# Patient Record
Sex: Male | Born: 1970 | Race: White | Hispanic: No | Marital: Married | Smoking: Former smoker
Health system: Southern US, Community
[De-identification: ages and names within clinical notes are randomized; demographics above are authoritative.]

## PROBLEM LIST (undated history)

## (undated) DIAGNOSIS — F419 Anxiety disorder, unspecified: Secondary | ICD-10-CM

## (undated) DIAGNOSIS — Z860100 Personal history of colon polyps, unspecified: Secondary | ICD-10-CM

## (undated) DIAGNOSIS — E7800 Pure hypercholesterolemia, unspecified: Secondary | ICD-10-CM

## (undated) DIAGNOSIS — D649 Anemia, unspecified: Secondary | ICD-10-CM

## (undated) DIAGNOSIS — F988 Other specified behavioral and emotional disorders with onset usually occurring in childhood and adolescence: Secondary | ICD-10-CM

## (undated) DIAGNOSIS — G4721 Circadian rhythm sleep disorder, delayed sleep phase type: Secondary | ICD-10-CM

## (undated) DIAGNOSIS — H524 Presbyopia: Secondary | ICD-10-CM

## (undated) DIAGNOSIS — J45909 Unspecified asthma, uncomplicated: Secondary | ICD-10-CM

## (undated) DIAGNOSIS — Z8719 Personal history of other diseases of the digestive system: Secondary | ICD-10-CM

## (undated) DIAGNOSIS — K449 Diaphragmatic hernia without obstruction or gangrene: Secondary | ICD-10-CM

## (undated) DIAGNOSIS — K219 Gastro-esophageal reflux disease without esophagitis: Secondary | ICD-10-CM

## (undated) DIAGNOSIS — G4730 Sleep apnea, unspecified: Secondary | ICD-10-CM

## (undated) DIAGNOSIS — M1990 Unspecified osteoarthritis, unspecified site: Secondary | ICD-10-CM

## (undated) DIAGNOSIS — E669 Obesity, unspecified: Secondary | ICD-10-CM

## (undated) HISTORY — DX: Circadian rhythm sleep disorder, delayed sleep phase type: G47.21

## (undated) HISTORY — DX: Unspecified osteoarthritis, unspecified site: M19.90

## (undated) HISTORY — PX: LAPAROSCOPIC GASTRIC BAND REMOVAL WITH LAPAROSCOPIC GASTRIC SLEEVE RESECTION: SHX6498

## (undated) HISTORY — PX: COLONOSCOPY: SHX174

## (undated) HISTORY — DX: Anxiety disorder, unspecified: F41.9

## (undated) HISTORY — DX: Obesity, unspecified: E66.9

## (undated) HISTORY — PX: BACK SURGERY: SHX140

## (undated) HISTORY — DX: Unspecified asthma, uncomplicated: J45.909

## (undated) HISTORY — DX: Personal history of colonic polyps: Z86.010

## (undated) HISTORY — PX: SHOULDER SURGERY: SHX246

## (undated) HISTORY — PX: ESOPHAGOGASTRODUODENOSCOPY: SHX1529

## (undated) HISTORY — DX: Presbyopia: H52.4

## (undated) HISTORY — DX: Pure hypercholesterolemia, unspecified: E78.00

## (undated) HISTORY — DX: Personal history of other diseases of the digestive system: Z87.19

## (undated) HISTORY — PX: CHOLECYSTECTOMY: SHX55

## (undated) HISTORY — DX: Diaphragmatic hernia without obstruction or gangrene: K44.9

## (undated) HISTORY — PX: ARTHROSCOPIC REPAIR ACL: SUR80

## (undated) HISTORY — DX: Personal history of colon polyps, unspecified: Z86.0100

## (undated) HISTORY — PX: KNEE SURGERY: SHX244

## (undated) HISTORY — DX: Anemia, unspecified: D64.9

## (undated) HISTORY — DX: Gastro-esophageal reflux disease without esophagitis: K21.9

---

## 2011-07-19 DIAGNOSIS — E291 Testicular hypofunction: Secondary | ICD-10-CM | POA: Insufficient documentation

## 2011-07-19 DIAGNOSIS — F419 Anxiety disorder, unspecified: Secondary | ICD-10-CM | POA: Insufficient documentation

## 2011-09-28 HISTORY — PX: BARIATRIC SURGERY: SHX1103

## 2012-11-17 DIAGNOSIS — H04123 Dry eye syndrome of bilateral lacrimal glands: Secondary | ICD-10-CM | POA: Insufficient documentation

## 2012-11-17 DIAGNOSIS — Q078 Other specified congenital malformations of nervous system: Secondary | ICD-10-CM | POA: Insufficient documentation

## 2012-11-17 DIAGNOSIS — H524 Presbyopia: Secondary | ICD-10-CM | POA: Insufficient documentation

## 2013-05-31 DIAGNOSIS — R868 Other abnormal findings in specimens from male genital organs: Secondary | ICD-10-CM | POA: Insufficient documentation

## 2013-05-31 DIAGNOSIS — N4611 Organic oligospermia: Secondary | ICD-10-CM | POA: Insufficient documentation

## 2016-10-18 DIAGNOSIS — K219 Gastro-esophageal reflux disease without esophagitis: Secondary | ICD-10-CM | POA: Insufficient documentation

## 2017-01-21 DIAGNOSIS — G4733 Obstructive sleep apnea (adult) (pediatric): Secondary | ICD-10-CM | POA: Insufficient documentation

## 2017-10-23 ENCOUNTER — Other Ambulatory Visit: Payer: Self-pay

## 2017-10-23 ENCOUNTER — Emergency Department
Admission: EM | Admit: 2017-10-23 | Discharge: 2017-10-23 | Disposition: A | Discharge status: Home / Self Care | Payer: 59 | Source: Home / Self Care | Attending: Family Medicine | Admitting: Family Medicine

## 2017-10-23 ENCOUNTER — Emergency Department (INDEPENDENT_AMBULATORY_CARE_PROVIDER_SITE_OTHER): Payer: 59

## 2017-10-23 DIAGNOSIS — M5136 Other intervertebral disc degeneration, lumbar region: Secondary | ICD-10-CM | POA: Diagnosis not present

## 2017-10-23 DIAGNOSIS — M5442 Lumbago with sciatica, left side: Secondary | ICD-10-CM

## 2017-10-23 DIAGNOSIS — M545 Low back pain, unspecified: Secondary | ICD-10-CM

## 2017-10-23 DIAGNOSIS — G8929 Other chronic pain: Secondary | ICD-10-CM

## 2017-10-23 HISTORY — DX: Sleep apnea, unspecified: G47.30

## 2017-10-23 IMAGING — DX DG LUMBAR SPINE COMPLETE 4+V
5 series · 5 of 5 positions shown · non-contrast
Comparison: None.

CLINICAL DATA: Low back injury and pain while lifting heavy ladder
today. Initial encounter.

EXAM:
LUMBAR SPINE - COMPLETE 4+ VIEW

[l-spine ap]
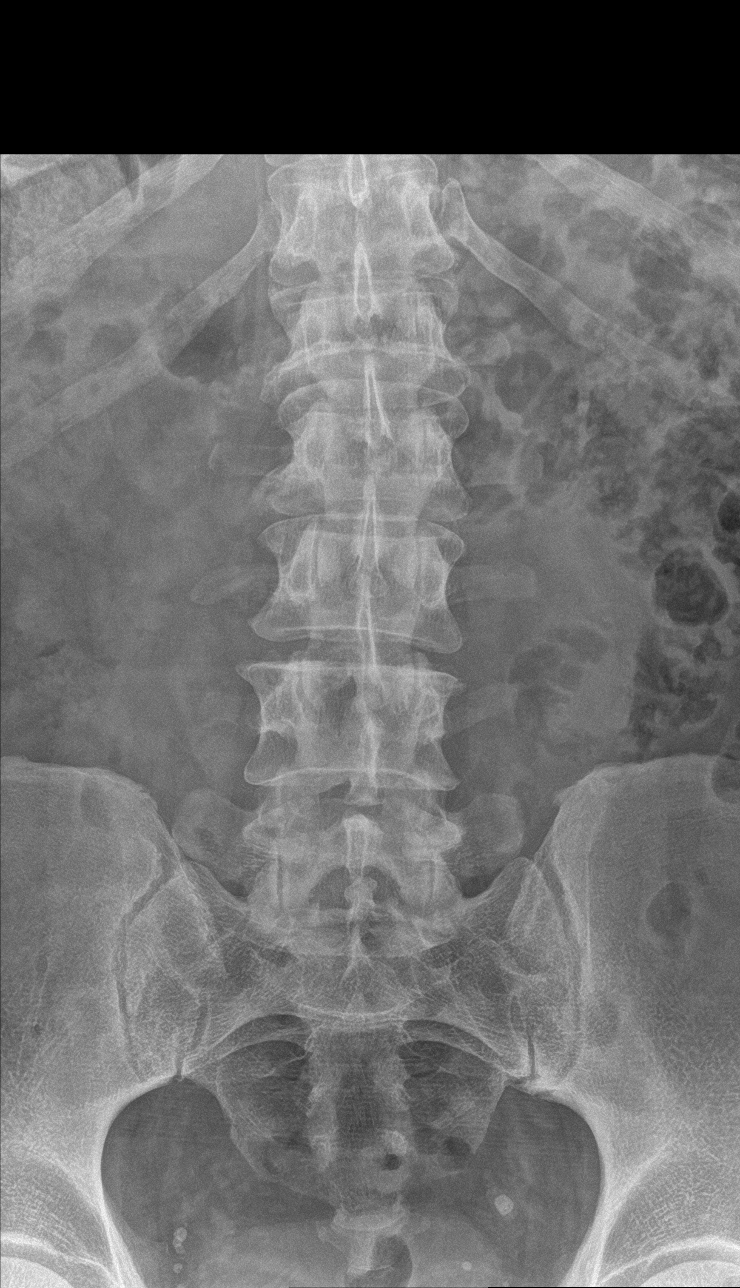

[l-spine obl (1 of 2)]
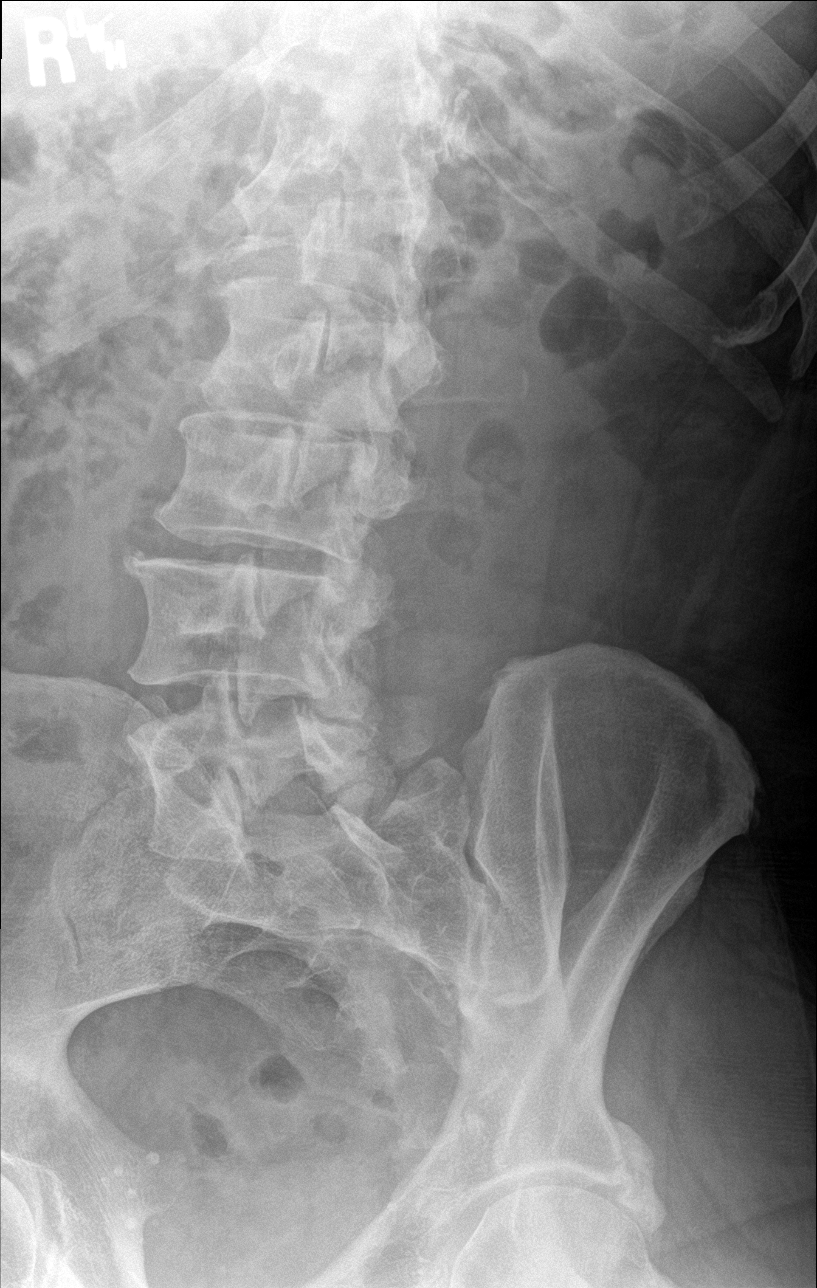

[l-spine obl (2 of 2)]
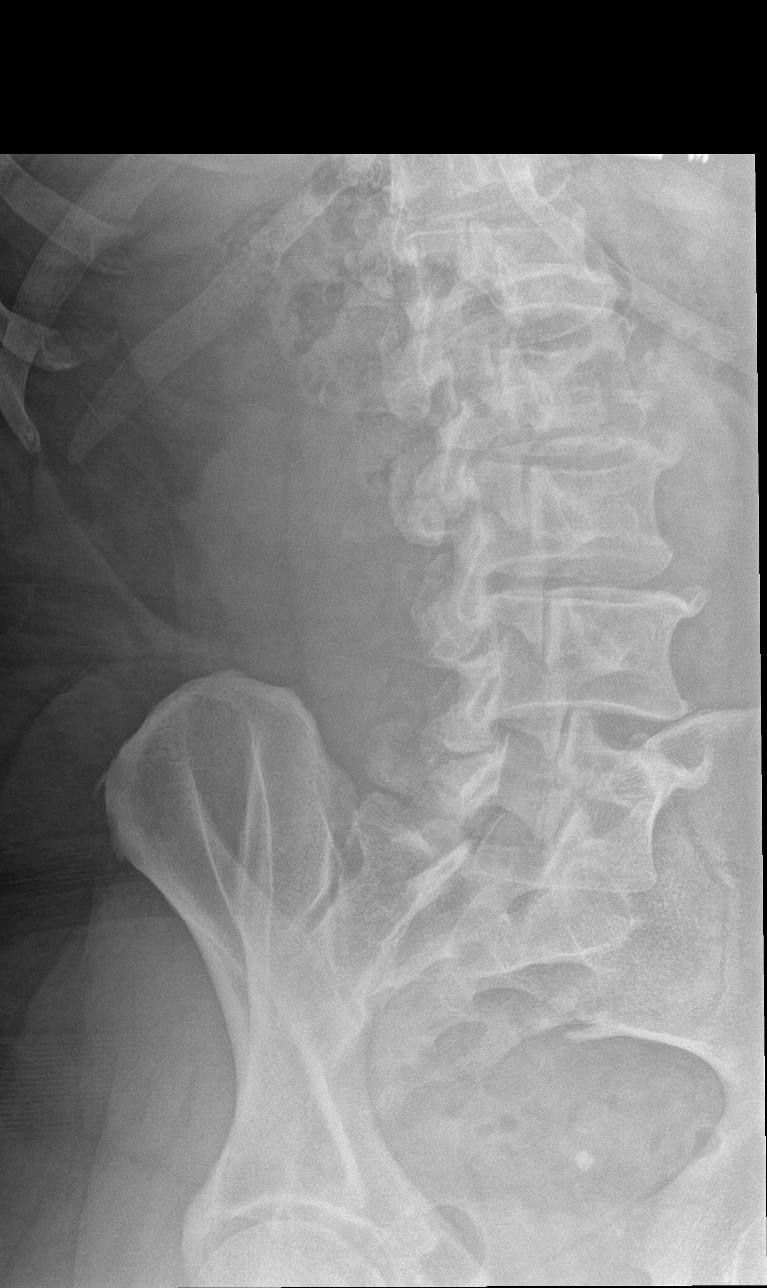

[l-spine lat]
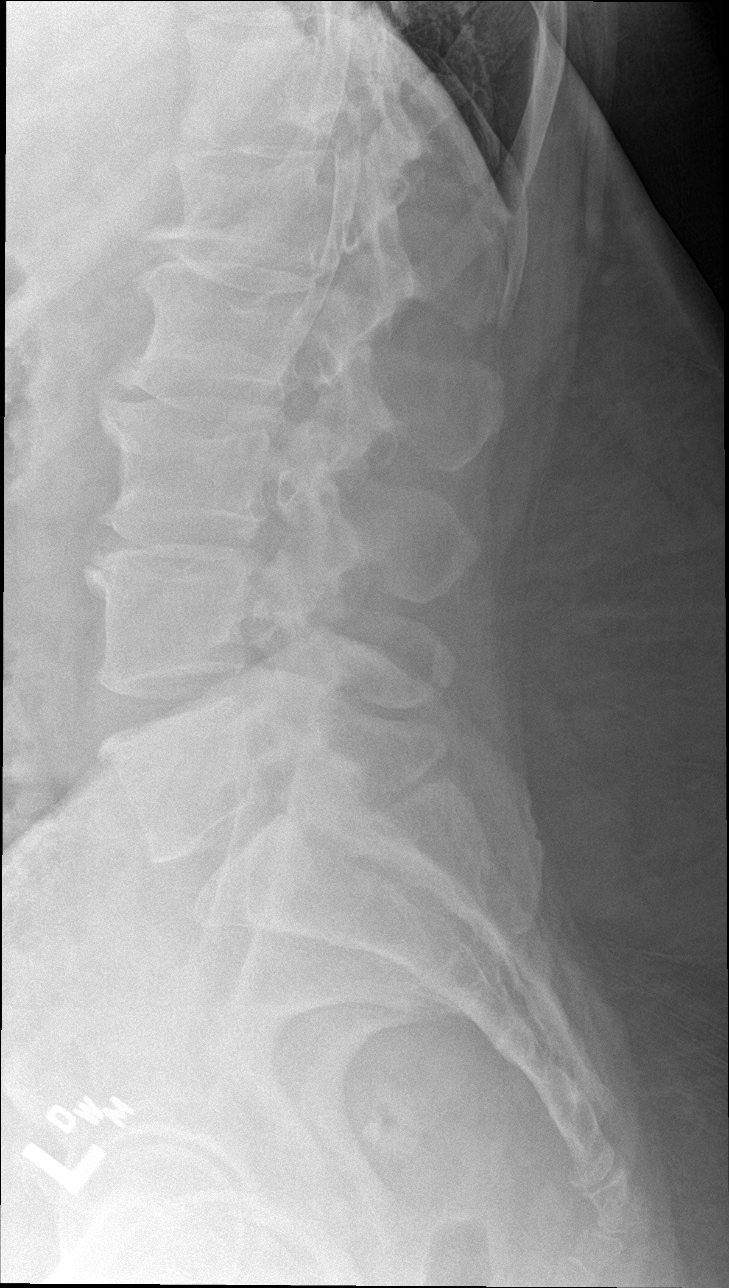

[l-spine spot]
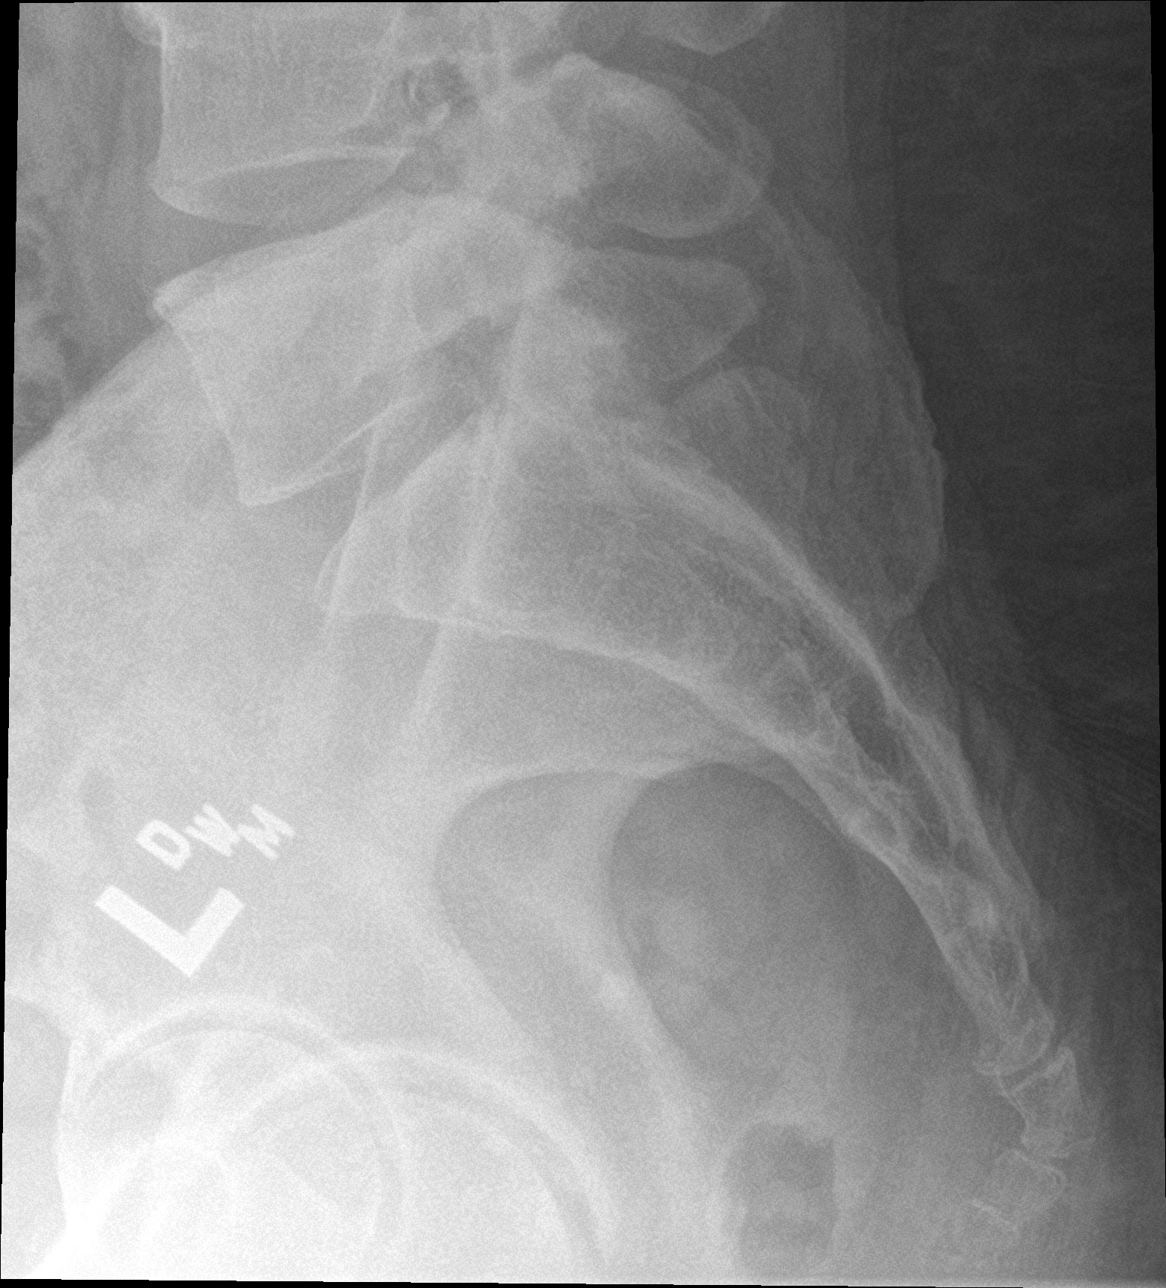

[5 of 5 positions shown; findings below may reference images not displayed]

FINDINGS: There is no evidence of lumbar spine fracture. Alignment is normal.
Mild degenerative disc disease seen from levels of L1-L4. No other
significant bone abnormality identified.
IMPRESSION: No acute findings.  Mild degenerative disc disease from L1-L4.

## 2017-10-23 MED ORDER — PREDNISONE 20 MG PO TABS: ORAL_TABLET | ORAL | 0 refills | Status: DC

## 2017-10-23 MED ORDER — METHOCARBAMOL 500 MG PO TABS: 500.0000 mg | ORAL_TABLET | Freq: Two times a day (BID) | ORAL | 0 refills | Status: DC

## 2017-10-23 MED ORDER — TRAMADOL HCL 50 MG PO TABS: 50.0000 mg | ORAL_TABLET | Freq: Four times a day (QID) | ORAL | 0 refills | Status: DC | PRN
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
## 2017-10-23 NOTE — ED Triage Notes (Signed)
Pt worked in yard cutting up trees yesterday, and was sore this morning, He continued working in [place] today, and was putting the ladder back in the garage, and when he lifted it, he felt a tweek in the lower back.

## 2017-10-23 NOTE — Discharge Instructions (Signed)
°  You may take 500mg  acetaminophen every 4-6 hours or in combination with ibuprofen 400-600mg  every 6-8 hours as needed for pain and inflammation.  You have been prescribed 5 days of prednisone, an oral steroid to help with inflammation and pain in your back. You may start this medication tomorrow with breakfast as it can make it difficult to sleep if taken at night.  Robaxin (methocarbamol) is a muscle relaxer and may cause drowsiness. Do not drink alcohol, drive, or operate heavy machinery while taking.  Tramadol is strong pain medication. While taking, do not drink alcohol, drive, or perform any other activities that requires focus while taking these medications.

## 2017-12-13 DIAGNOSIS — M47816 Spondylosis without myelopathy or radiculopathy, lumbar region: Secondary | ICD-10-CM | POA: Insufficient documentation

## 2018-09-06 DIAGNOSIS — M5416 Radiculopathy, lumbar region: Secondary | ICD-10-CM | POA: Insufficient documentation

## 2018-11-03 DIAGNOSIS — M47816 Spondylosis without myelopathy or radiculopathy, lumbar region: Secondary | ICD-10-CM | POA: Insufficient documentation

## 2018-11-03 DIAGNOSIS — M533 Sacrococcygeal disorders, not elsewhere classified: Secondary | ICD-10-CM | POA: Insufficient documentation

## 2020-03-16 DIAGNOSIS — S40012A Contusion of left shoulder, initial encounter: Secondary | ICD-10-CM | POA: Diagnosis not present

## 2020-03-16 DIAGNOSIS — S0990XA Unspecified injury of head, initial encounter: Secondary | ICD-10-CM | POA: Diagnosis not present

## 2020-03-16 DIAGNOSIS — S098XXA Other specified injuries of head, initial encounter: Secondary | ICD-10-CM | POA: Diagnosis not present

## 2020-03-16 DIAGNOSIS — Z23 Encounter for immunization: Secondary | ICD-10-CM | POA: Diagnosis not present

## 2020-03-16 DIAGNOSIS — Z043 Encounter for examination and observation following other accident: Secondary | ICD-10-CM | POA: Diagnosis not present

## 2020-03-16 DIAGNOSIS — S0001XA Abrasion of scalp, initial encounter: Secondary | ICD-10-CM | POA: Diagnosis not present

## 2020-03-16 DIAGNOSIS — S0003XA Contusion of scalp, initial encounter: Secondary | ICD-10-CM | POA: Diagnosis not present

## 2020-03-16 DIAGNOSIS — G44309 Post-traumatic headache, unspecified, not intractable: Secondary | ICD-10-CM | POA: Diagnosis not present

## 2020-03-16 DIAGNOSIS — M25512 Pain in left shoulder: Secondary | ICD-10-CM | POA: Diagnosis not present

## 2020-05-27 ENCOUNTER — Emergency Department
Admission: EM | Admit: 2020-05-27 | Discharge: 2020-05-27 | Disposition: A | Discharge status: Home / Self Care | Payer: 59 | Source: Home / Self Care | Attending: Family Medicine | Admitting: Family Medicine

## 2020-05-27 ENCOUNTER — Other Ambulatory Visit: Payer: Self-pay

## 2020-05-27 ENCOUNTER — Encounter: Payer: Self-pay | Admitting: Emergency Medicine

## 2020-05-27 DIAGNOSIS — Z20822 Contact with and (suspected) exposure to covid-19: Secondary | ICD-10-CM | POA: Diagnosis not present

## 2020-05-27 DIAGNOSIS — J069 Acute upper respiratory infection, unspecified: Secondary | ICD-10-CM

## 2020-05-27 HISTORY — DX: Other specified behavioral and emotional disorders with onset usually occurring in childhood and adolescence: F98.8

## 2020-05-27 NOTE — ED Provider Notes (Signed)
[name]    CSN: [id number] Arrival date & time: 05/27/20  1258      History   Chief Complaint Chief Complaint  Patient presents with  . Sore Throat    HPI Jared Tran is a 49 y.o. male.   Last night patient developed myalgias and mild cough.  Today he had some nasal congestion and sore throat.  He denies fevers, chills, and sweats.   He denies chest tightness, shortness of breath, and changes in taste/smell.    The history is provided by the patient.    Past Medical History:  Diagnosis Date  . ADD (attention deficit disorder)   . Sleep apnea     There are no problems to display for this patient.   Past Surgical History:  Procedure Laterality Date  . ARTHROSCOPIC REPAIR ACL    . LAPAROSCOPIC GASTRIC BAND REMOVAL WITH LAPAROSCOPIC GASTRIC SLEEVE RESECTION         Home Medications    Prior to Admission medications   Medication Sig Start Date End Date Taking? Authorizing Provider  amphetamine-dextroamphetamine (ADDERALL XR) 10 MG 24 hr capsule Take 10 mg by mouth daily.   Yes [provider]  clonazePAM (KLONOPIN) 1 MG tablet Take 1 mg by mouth 2 (two) times daily.    [provider]  liraglutide (VICTOZA) 18 MG/3ML SOPN Inject 1.8 mg into the skin.    [provider]    Family History Family History  Problem Relation Age of Onset  . Healthy Father     Social History Social History   Tobacco Use  . Smoking status: Never Smoker  . Smokeless tobacco: Never Used  Vaping Use  . Vaping Use: Never used  Substance Use Topics  . Alcohol use: No  . Drug use: No     Allergies   Penicillins   Review of Systems Review of Systems + sore throat + cough No pleuritic pain No wheezing + nasal congestion + post-nasal drainage No sinus pain/pressure No itchy/red eyes No earache No hemoptysis No SOB No fever/chills No nausea No vomiting No abdominal pain No diarrhea No urinary symptoms No skin  rash + fatigue + myalgias  No headache   Physical Exam Triage Vital Signs ED Triage Vitals  Enc Vitals Group     BP 05/27/20 1355 125/79     Pulse Rate 05/27/20 1355 67     Resp 05/27/20 1355 18     Temp 05/27/20 1355 98.1 F (36.7 C)     Temp Source 05/27/20 1355 Oral     SpO2 05/27/20 1355 99 %     Weight 05/27/20 1356 235 lb (106.6 kg)     Height 05/27/20 1356 5\' 11"  (1.803 m)     Head Circumference --      Peak Flow --      Pain Score 05/27/20 1356 6     Pain Loc --      Pain Edu? --      Excl. in GC? --    No data found.  Updated Vital Signs BP 125/79 (BP Location: Right Arm)   Pulse 67   Temp 98.1 F (36.7 C) (Oral)   Resp 18   Ht 5\' 11"  (1.803 m)   Wt 106.6 kg   SpO2 99%   BMI 32.78 kg/m   Visual Acuity Right Eye Distance:   Left Eye Distance:   Bilateral Distance:    Right Eye Near:   Left Eye Near:    Bilateral  Near:     Physical Exam Nursing notes and Vital Signs reviewed. Appearance:  Patient appears stated age, and in no acute distress Eyes:  Pupils are equal, round, and reactive to light and accomodation.  Extraocular movement is intact.  Conjunctivae are not inflamed  Ears:  Canals normal.  Tympanic membranes normal.  Nose:  Mildly congested turbinates.  No sinus tenderness.  Pharynx:  Normal Neck:  Supple. No adenopathy. Lungs:  Clear to auscultation.  Breath sounds are equal.  Moving air well. Heart:  Regular rate and rhythm without murmurs, rubs, or gallops.  Abdomen:  Nontender without masses or hepatosplenomegaly.  Bowel sounds are present.  No CVA or flank tenderness.  Extremities:  No edema.  Skin:  No rash present.   UC Treatments / Results  Labs (all labs ordered are listed, but only abnormal results are displayed) Labs Reviewed  SARS-COV-2 RNA,(COVID-19) QUALITATIVE NAAT    EKG   Radiology No results found.  Procedures Procedures (including critical care time)  Medications Ordered in UC Medications - No data to  display  Initial Impression / Assessment and Plan / UC Course  I have reviewed the triage vital signs and the nursing notes.  Pertinent labs & imaging results that were available during my care of the patient were reviewed by me and considered in my medical decision making (see chart for details).    Benign exam.  There is no evidence of bacterial infection today.  Treat symptomatically for now  COVID19 PCR pending.   Final Clinical Impressions(s) / UC Diagnoses   Final diagnoses:  Contact with and (suspected) exposure to covid-19  Viral URI with cough     Discharge Instructions     Take plain guaifenesin (1200mg  extended release tabs such as Mucinex) twice daily, with plenty of water, for cough and congestion. Get adequate rest.   May use Afrin nasal spray (or generic oxymetazoline) each morning for about 5 days and then discontinue.  Also recommend using saline nasal spray several times daily and saline nasal irrigation (AYR is a common brand).  Use Flonase nasal spray each morning after using Afrin nasal spray and saline nasal irrigation. Try warm salt water gargles for sore throat.  Stop all antihistamines for now, and other non-prescription cough/cold preparations. May take Ibuprofen 200mg , 4 tabs every 8 hours with food for body aches, fever, etc.   Isolate yourself until COVID-19 test result is available.   If your COVID19 test is positive, then you are infected with the novel coronavirus and could give the virus to others.  Please continue isolation at home for at least 10 days since the start of your symptoms.  Once you complete your 10 day quarantine, you may return to normal activities as long as you've not had a fever for over 24 hours (without taking fever reducing medicine) and your symptoms are improving. Please continue good preventive care measures, including:  frequent hand-washing, avoid touching your face, cover coughs/sneezes, stay out of crowds and keep a 6 foot  distance from others.  Go to the nearest hospital emergency room if fever/cough/breathlessness are severe or illness seems like a threat to life.    ED Prescriptions    None        , MD 05/29/20 1044

## 2020-05-27 NOTE — ED Triage Notes (Addendum)
Sore throat, body aches, runny nose started last night Vaccinated

## 2020-05-27 NOTE — Discharge Instructions (Addendum)
Take plain guaifenesin (1200mg  extended release tabs such as Mucinex) twice daily, with plenty of water, for cough and congestion. Get adequate rest.   May use Afrin nasal spray (or generic oxymetazoline) each morning for about 5 days and then discontinue.  Also recommend using saline nasal spray several times daily and saline nasal irrigation (AYR is a common brand).  Use Flonase nasal spray each morning after using Afrin nasal spray and saline nasal irrigation. Try warm salt water gargles for sore throat.  Stop all antihistamines for now, and other non-prescription cough/cold preparations. May take Ibuprofen 200mg , 4 tabs every 8 hours with food for body aches, fever, etc.   Isolate yourself until COVID-19 test result is available.   If your COVID19 test is positive, then you are infected with the novel coronavirus and could give the virus to others.  Please continue isolation at home for at least 10 days since the start of your symptoms.  Once you complete your 10 day quarantine, you may return to normal activities as long as you've not had a fever for over 24 hours (without taking fever reducing medicine) and your symptoms are improving. Please continue good preventive care measures, including:  frequent hand-washing, avoid touching your face, cover coughs/sneezes, stay out of crowds and keep a 6 foot distance from others.  Go to the nearest hospital emergency room if fever/cough/breathlessness are severe or illness seems like a threat to life.

## 2020-05-28 LAB — SARS-COV-2 RNA,(COVID-19) QUALITATIVE NAAT: SARS CoV2 RNA: NOT DETECTED

## 2020-07-31 ENCOUNTER — Encounter: Payer: Self-pay | Admitting: Medical-Surgical

## 2020-07-31 ENCOUNTER — Ambulatory Visit (INDEPENDENT_AMBULATORY_CARE_PROVIDER_SITE_OTHER): Payer: 59 | Admitting: Medical-Surgical

## 2020-07-31 VITALS — BP 125/79 | HR 78 | Temp 98.1°F | Ht 71.5 in | Wt 259.7 lb

## 2020-07-31 DIAGNOSIS — R5383 Other fatigue: Secondary | ICD-10-CM

## 2020-07-31 DIAGNOSIS — F419 Anxiety disorder, unspecified: Secondary | ICD-10-CM | POA: Diagnosis not present

## 2020-07-31 DIAGNOSIS — Z7689 Persons encountering health services in other specified circumstances: Secondary | ICD-10-CM

## 2020-07-31 DIAGNOSIS — G4733 Obstructive sleep apnea (adult) (pediatric): Secondary | ICD-10-CM | POA: Diagnosis not present

## 2020-07-31 DIAGNOSIS — S4992XD Unspecified injury of left shoulder and upper arm, subsequent encounter: Secondary | ICD-10-CM | POA: Diagnosis not present

## 2020-07-31 DIAGNOSIS — R41840 Attention and concentration deficit: Secondary | ICD-10-CM | POA: Diagnosis not present

## 2020-07-31 DIAGNOSIS — F1320 Sedative, hypnotic or anxiolytic dependence, uncomplicated: Secondary | ICD-10-CM | POA: Diagnosis not present

## 2020-07-31 NOTE — Progress Notes (Signed)
New Patient Office Visit  Subjective:  Patient ID: Jared Tran, male    DOB: 1971/09/11  Age: 49 y.o. MRN: [id number]  CC:  Chief Complaint  Patient presents with  . Establish Care    HPI Jared Tran presents to establish care.   ADD-has never been formally tested for ADHD.  Reports he was referred for testing while with [facility] but after almost 2 years of waiting to be scheduled, he was told that the testing provider did not accept any insurance and it would be an out-of-pocket cost of $2500.  He was unable to complete this.  His previous PCP did agree to start him on a low-dose of Adderall to see if this would help.  He is currently taking Adderall XR 10mg  daily and reports this does help to focus.  Unfortunately around lunchtime, he feels that it wears off.  He only takes this Monday through Friday while working and admits to sometimes doubling up to take a second dose in the afternoon if needed.   Sleep apnea-he does have a CPAP at home that works but he does not use it.  He is not having any sleep apnea symptoms including headaches, gasping awake, choking, etc.  He does have difficulty sleeping and reports having a delayed sleep onset.  He takes ZzzQuil and melatonin at night to help him sleep.  Uses a special light in the mornings that was recommended by neurology.  Reports a slight is a full-spectrum light and helps him wake up fully.  Left shoulder pain-this past Father's Day, he was with his family camping at [place].  He and his child were scattering around the bike trail.  He had a fall and hit both his head and his left shoulder.  He went to the ED for evaluation where they did an x-ray.  The x-ray did not show any fractures or dislocations.  He has continued to have left shoulder pain and feels there may be an impingement.  He has not had any further work-up or intervention for his left shoulder pain.  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  Wonders if he should see orthopedics.    Fatigue-notes that he used to be extremely active but has noticed he is very fatigued recently.  Reports feeling wiped out yesterday by 7pm.  Had a gastric bypass and lost down to 180 pounds but over the last year has gained weight.  After exercise, he has noted he does not recover well, sometimes staying sore for a week instead of just a couple of days.   Anxiety-originally started Klonopin 0.5 BID many years ago but then increased to 1mg  BID.  He has been at this dose for over 10 years.  Reports taking 1 mg twice daily but admits that some days he gets up in the morning and takes two 1 mg tablets together and this manages his anxiety all day.  Past Medical History:  Diagnosis Date  . ADD (attention deficit disorder)   . Anxiety   . Arthritis   . Delayed sleep phase syndrome   . Presbyopia   . Sleep apnea     Past Surgical History:  Procedure Laterality Date  . ARTHROSCOPIC REPAIR ACL    . CHOLECYSTECTOMY    . KNEE SURGERY    . LAPAROSCOPIC GASTRIC BAND REMOVAL WITH LAPAROSCOPIC GASTRIC SLEEVE RESECTION    . SHOULDER SURGERY  Family History  Problem Relation Age of Onset  . Deep vein thrombosis Father   . Pulmonary embolism Father   . Seizures Father   . Sleep apnea Father   . Diabetes Paternal Uncle   . Alzheimer's disease Maternal Grandmother   . Skin cancer Paternal Grandmother   . Alzheimer's disease Paternal Grandmother   . Heart attack Paternal Grandfather     Social History   Socioeconomic History  . Marital status: Married    Spouse name: Not on file  . Number of children: Not on file  . Years of education: Not on file  . Highest education level: Not on file  Occupational History  . Not on file  Tobacco Use  . Smoking status: Former Smoker    Quit date: 08/01/1999    Years since quitting: 21.0  . Smokeless tobacco: Never Used  Vaping Use  . Vaping Use: Never used   Substance and Sexual Activity  . Alcohol use: No    Comment: Occasionally  . Drug use: Never  . Sexual activity: Not Currently    Partners: Female  Other Topics Concern  . Not on file  Social History Narrative  . Not on file   Social Determinants of Health   Financial Resource Strain:   . Difficulty of Paying Living Expenses: Not on file  Food Insecurity:   . Worried About Programme researcher, broadcasting/film/video in the Last Year: Not on file  . Ran Out of Food in the Last Year: Not on file  Transportation Needs:   . Lack of Transportation (Medical): Not on file  . Lack of Transportation (Non-Medical): Not on file  Physical Activity:   . Days of Exercise per Week: Not on file  . Minutes of Exercise per Session: Not on file  Stress:   . Feeling of Stress : Not on file  Social Connections:   . Frequency of Communication with Friends and Family: Not on file  . Frequency of Social Gatherings with Friends and Family: Not on file  . Attends Religious Services: Not on file  . Active Member of Clubs or Organizations: Not on file  . Attends Banker Meetings: Not on file  . Marital Status: Not on file  Intimate Partner Violence:   . Fear of Current or Ex-Partner: Not on file  . Emotionally Abused: Not on file  . Physically Abused: Not on file  . Sexually Abused: Not on file    ROS Review of Systems  Constitutional: Positive for fatigue. Negative for chills, fever and unexpected weight change.  Respiratory: Negative for cough, chest tightness and shortness of breath.   Cardiovascular: Negative for chest pain, palpitations and leg swelling.  Musculoskeletal: Positive for arthralgias, back pain and myalgias.  Psychiatric/Behavioral: Positive for decreased concentration and sleep disturbance. Negative for dysphoric mood. The patient is nervous/anxious.     Objective:   Today's Vitals: BP 125/79   Pulse 78   Temp 98.1 F (36.7 C) (Oral)   Ht 5' 11.5" (1.816 m)   Wt 259 lb 11.2 oz  (117.8 kg)   SpO2 98%   BMI 35.72 kg/m   Physical Exam Vitals and nursing note reviewed.  Constitutional:      General: He is not in acute distress.    Appearance: Normal appearance.  HENT:     Head: Normocephalic and atraumatic.  Cardiovascular:     Rate and Rhythm: Normal rate and regular rhythm.     Pulses: Normal pulses.  Heart sounds: Normal heart sounds. No murmur heard.  No friction rub. No gallop.   Pulmonary:     Effort: Pulmonary effort is normal. No respiratory distress.     Breath sounds: Normal breath sounds.  Musculoskeletal:     Left shoulder: Decreased range of motion.  Skin:    General: Skin is warm and dry.  Neurological:     Mental Status: He is alert and oriented to person, place, and time.  Psychiatric:        Mood and Affect: Mood normal.        Behavior: Behavior normal.        Thought Content: Thought content normal.        Judgment: Judgment normal.     Assessment & Plan:   1. Encounter to establish care Reviewed available information discussed health care concerns with patient.  He does have a lot of concerns today so I advised him that we will need to focus on the most important things today and then can work on the other concerns as we go.  2. Benzodiazepine dependence (HCC) Discussed benzodiazepine dependence and its physical and psychological effects.  He has been on this medication long-term.  Advised the patient that I will be able to manage this for him but we will never increase his dose and he will be limited to 30-day supply at that time due to legal limitations on prescribing in [place].  He verbalized understanding and is agreeable to the plan.  3. Inattention Referring to psychology for ADHD testing.  Advised that there scheduling quite a ways out for this testing but it would be best to go ahead and get this in the works. - Ambulatory referral to Psychology  4. Injury of left shoulder, subsequent encounter His x-ray did  not show any concerning findings but he continues to have left shoulder pain.  Ordering an MRI of the left shoulder without contrast to be completed ASAP.  Approximately 1 to 2 weeks after he has this completed, advised him to follow-up with Dr. Karie Schwalbe in our office for further evaluation and treatment. - MR Shoulder Left Wo Contrast; Future  5. Fatigue, unspecified type He will be due for his annual physical exam soon.  Will be getting labs at that time and will include work-up for fatigue.  6. Anxiety Discussed alternative maintenance treatment.  He has not done well with SSRIs in the past but has not tried any other class of antidepressants.  May benefit from tricyclic antidepressant such as amitriptyline since he does have difficulty sleeping at night as well as generalized body aches.  Continue Klonopin 1 mg twice daily, see above.  7. Obstructive sleep apnea syndrome Recommend wearing CPAP when sleeping.   Outpatient Encounter Medications as of 07/31/2020  Medication Sig  . amphetamine-dextroamphetamine (ADDERALL XR) 10 MG 24 hr capsule Take 10 mg by mouth daily.  . calcium-vitamin D (OSCAL WITH D) 500-200 MG-UNIT TABS tablet Take 1 tablet by mouth daily.  . clonazePAM (KLONOPIN) 1 MG tablet Take 1 mg by mouth 2 (two) times daily.  . Cyanocobalamin (VITAMIN B-12) 5000 MCG TBDP Take 1 tablet by mouth daily.  . diphenhydrAMINE HCl (ZZZQUIL) 50 MG/30ML LIQD Take by mouth at bedtime.  . Ferrous Sulfate (IRON PO) Take 1 tablet by mouth daily.  Marland Kitchen ibuprofen (ADVIL) 800 MG tablet Take 1 tablet by mouth daily as needed.  . lidocaine (LMX) 4 % cream Apply 1 application topically daily as needed.  . MULTIPLE VITAMIN  PO Take 1 tablet by mouth 2 (two) times daily.  . [DISCONTINUED] liraglutide (VICTOZA) 18 MG/3ML SOPN Inject 1.8 mg into the skin. (Patient not taking: Reported on 07/31/2020)   No facility-administered encounter medications on file as of 07/31/2020.    Follow-up: Return for annual  physical exam at your convenience.   Thayer OhmJoy L. Sidra Oldfield, DNP, APRN, FNP-BC [facility]

## 2020-08-12 ENCOUNTER — Encounter: Payer: Self-pay | Admitting: Medical-Surgical

## 2020-08-13 NOTE — Patient Instructions (Signed)
Preventive Care 41-51 Years Old, Male Preventive care refers to lifestyle choices and visits with your health care provider that can promote health and wellness. This includes:  A yearly physical exam. This is also called an annual well check.  Regular dental and eye exams.  Immunizations.  Screening for certain conditions.  Healthy lifestyle choices, such as eating a healthy diet, getting regular exercise, not using drugs or products that contain nicotine and tobacco, and limiting alcohol use. What can I expect for my preventive care visit? Physical exam Your health care provider will check:  Height and weight. These may be used to calculate body mass index (BMI), which is a measurement that tells if you are at a healthy weight.  Heart rate and blood pressure.  Your skin for abnormal spots. Counseling Your health care provider may ask you questions about:  Alcohol, tobacco, and drug use.  Emotional well-being.  Home and relationship well-being.  Sexual activity.  Eating habits.  Work and work Statistician. What immunizations do I need?  Influenza (flu) vaccine  This is recommended every year. Tetanus, diphtheria, and pertussis (Tdap) vaccine  You may need a Td booster every 10 years. Varicella (chickenpox) vaccine  You may need this vaccine if you have not already been vaccinated. Zoster (shingles) vaccine  You may need this after age 64. Measles, mumps, and rubella (MMR) vaccine  You may need at least one dose of MMR if you were born in 1957 or later. You may also need a second dose. Pneumococcal conjugate (PCV13) vaccine  You may need this if you have certain conditions and were not previously vaccinated. Pneumococcal polysaccharide (PPSV23) vaccine  You may need one or two doses if you smoke cigarettes or if you have certain conditions. Meningococcal conjugate (MenACWY) vaccine  You may need this if you have certain conditions. Hepatitis A  vaccine  You may need this if you have certain conditions or if you travel or work in places where you may be exposed to hepatitis A. Hepatitis B vaccine  You may need this if you have certain conditions or if you travel or work in places where you may be exposed to hepatitis B. Haemophilus influenzae type b (Hib) vaccine  You may need this if you have certain risk factors. Human papillomavirus (HPV) vaccine  If recommended by your health care provider, you may need three doses over 6 months. You may receive vaccines as individual doses or as more than one vaccine together in one shot (combination vaccines). Talk with your health care provider about the risks and benefits of combination vaccines. What tests do I need? Blood tests  Lipid and cholesterol levels. These may be checked every 5 years, or more frequently if you are over 60 years old.  Hepatitis C test.  Hepatitis B test. Screening  Lung cancer screening. You may have this screening every year starting at age 43 if you have a 30-pack-year history of smoking and currently smoke or have quit within the past 15 years.  Prostate cancer screening. Recommendations will vary depending on your family history and other risks.  Colorectal cancer screening. All adults should have this screening starting at age 72 and continuing until age 2. Your health care provider may recommend screening at age 14 if you are at increased risk. You will have tests every 1-10 years, depending on your results and the type of screening test.  Diabetes screening. This is done by checking your blood sugar (glucose) after you have not eaten  for a while (fasting). You may have this done every 1-3 years.  Sexually transmitted disease (STD) testing. Follow these instructions at home: Eating and drinking  Eat a diet that includes fresh fruits and vegetables, whole grains, lean protein, and low-fat dairy products.  Take vitamin and mineral supplements as  recommended by your health care provider.  Do not drink alcohol if your health care provider tells you not to drink.  If you drink alcohol: ? Limit how much you have to 0-2 drinks a day. ? Be aware of how much alcohol is in your drink. In the [place] one drink equals one 12 oz bottle of beer (355 mL), one 5 oz glass of wine (148 mL), or one 1 oz glass of hard liquor (44 mL). Lifestyle  Take daily care of your teeth and gums.  Stay active. Exercise for at least 30 minutes on 5 or more days each week.  Do not use any products that contain nicotine or tobacco, such as cigarettes, e-cigarettes, and chewing tobacco. If you need help quitting, ask your health care provider.  If you are sexually active, practice safe sex. Use a condom or other form of protection to prevent STIs (sexually transmitted infections).  Talk with your health care provider about taking a low-dose aspirin every day starting at age 50. What's next?  Go to your health care provider once a year for a well check visit.  Ask your health care provider how often you should have your eyes and teeth checked.  Stay up to date on all vaccines. This information is not intended to replace advice given to you by your health care provider. Make sure you discuss any questions you have with your health care provider. Document Revised: 09/07/2018 Document Reviewed: 09/07/2018 Elsevier Patient Education  2020 [employer]  

## 2020-08-13 NOTE — Progress Notes (Signed)
HPI: Jared Tran is a 49 y.o. male who  has a past medical history of ADD (attention deficit disorder), Anxiety, Arthritis, Delayed sleep phase syndrome, Presbyopia, and Sleep apnea.  he presents to [facility] today, 08/14/20,  for chief complaint of: Annual physical exam  Dentist: 3 times a year, some gum disease Eye exam: UTD, wearing contacts but thinks the left one is not fitted right, would like a referral to a [facility] provider Exercise: None intentional since March 2020 Diet: tries to stay away from carbonated beverages, has a gastric vertical sleeve (9 years ago) Colon cancer screening:  Due, ordering today Prostate cancer screening: yearly PSA COVID vaccine: Done  Concerns: Leg cramps-has intermittent leg cramps at night that can be severe.  These cramps seem to occur on nights when his back has been hurting worse that day.  Has an appointment in February for ADHD testing.  Past medical, surgical, social and family history reviewed:  There are no problems to display for this patient.   Past Surgical History:  Procedure Laterality Date  . ARTHROSCOPIC REPAIR ACL    . CHOLECYSTECTOMY    . KNEE SURGERY    . LAPAROSCOPIC GASTRIC BAND REMOVAL WITH LAPAROSCOPIC GASTRIC SLEEVE RESECTION    . SHOULDER SURGERY      Social History   Tobacco Use  . Smoking status: Former Smoker    Quit date: 08/01/1999    Years since quitting: 21.0  . Smokeless tobacco: Never Used  Substance Use Topics  . Alcohol use: No    Comment: Occasionally    Family History  Problem Relation Age of Onset  . Deep vein thrombosis Father   . Pulmonary embolism Father   . Seizures Father   . Sleep apnea Father   . Diabetes Paternal Uncle   . Alzheimer's disease Maternal Grandmother   . Skin cancer Paternal Grandmother   . Alzheimer's disease Paternal Grandmother   . Heart attack Paternal Grandfather      Current medication list and allergy/intolerance  information reviewed:    Current Outpatient Medications  Medication Sig Dispense Refill  . amphetamine-dextroamphetamine (ADDERALL XR) 10 MG 24 hr capsule Take 10 mg by mouth daily.    . calcium-vitamin D (OSCAL WITH D) 500-200 MG-UNIT TABS tablet Take 1 tablet by mouth daily.    . clonazePAM (KLONOPIN) 1 MG tablet Take 1 mg by mouth 2 (two) times daily.    . Cyanocobalamin (VITAMIN B-12) 5000 MCG TBDP Take 1 tablet by mouth daily.    . diphenhydrAMINE HCl (ZZZQUIL) 50 MG/30ML LIQD Take by mouth at bedtime.    . Ferrous Sulfate (IRON PO) Take 1 tablet by mouth daily.    Marland Kitchen ibuprofen (ADVIL) 800 MG tablet Take 1 tablet by mouth daily as needed.    . lidocaine (LMX) 4 % cream Apply 1 application topically daily as needed.    . MULTIPLE VITAMIN PO Take 1 tablet by mouth 2 (two) times daily.     No current facility-administered medications for this visit.    Allergies  Allergen Reactions  . Hydrocodone-Acetaminophen Itching and Nausea Only  . Penicillins Hives and Rash      Review of Systems:  Constitutional:  No  fever, no chills, No recent illness, No unintentional weight changes. No significant fatigue.   HEENT: No  headache, no vision change, no hearing change, + sore throat, No sinus pressure  Cardiac: No  chest pain, No  pressure, No palpitations, No  Orthopnea  Respiratory:  No  shortness of breath. No  Cough  Gastrointestinal: No  abdominal pain, + nausea, + vomiting,  No  blood in stool, + diarrhea, No  constipation   Musculoskeletal: No new myalgia/arthralgia  Skin: + eczema rash, No other wounds/concerning lesions  Genitourinary: No  incontinence, No  abnormal genital bleeding, No abnormal genital discharge, + frequency, no burning/urgency  Hem/Onc: No  easy bruising/bleeding, No  abnormal lymph node  Endocrine: + cold intolerance,  No heat intolerance. No polyuria/polydipsia/polyphagia   Neurologic: No  weakness, No  dizziness, No  slurred speech/focal  weakness/facial droop  Psychiatric: No  concerns with depression, + concerns with anxiety, No sleep problems, No mood problems  Exam:  BP 107/70   Pulse 72   Temp 98.5 F (36.9 C) (Oral)   Ht 5' 10.5" (1.791 m)   Wt 261 lb (118.4 kg)   SpO2 98%   BMI 36.92 kg/m   Constitutional: VS see above. General Appearance: alert, well-developed, well-nourished, NAD  Eyes: Normal lids and conjunctive, non-icteric sclera  Ears, Nose, Mouth, Throat: MMM, Normal external inspection ears/nares/mouth/lips/gums. TM normal bilaterally.   Neck: No masses, trachea midline. No thyroid enlargement. No tenderness/mass appreciated. No lymphadenopathy  Respiratory: Normal respiratory effort. no wheeze, no rhonchi, no rales  Cardiovascular: S1/S2 normal, no murmur, no rub/gallop auscultated. RRR. No lower extremity edema. Pedal pulse II/IV bilaterally PT. No carotid bruit or JVD. No abdominal aortic bruit.  Gastrointestinal: Nontender, no masses. No hepatomegaly, no splenomegaly. No hernia appreciated. Bowel sounds normal. Rectal exam deferred.   Musculoskeletal: Gait normal. No clubbing/cyanosis of digits.   Neurological: Normal balance/coordination. No tremor. No cranial nerve deficit on limited exam. Motor and sensation intact and symmetric. Cerebellar reflexes intact.   Skin: warm, dry, intact. + eczema/dry skin to lower left leg. No concerning nevi or subq nodules on limited exam.    Psychiatric: Normal judgment/insight. Normal mood and affect. Oriented x3.   No results found for this or any previous visit (from the past 72 hour(s)).  No results found.   ASSESSMENT/PLAN:   1. Annual physical exam Checking CBC with differential, CMP, lipid panel, and A1c today. - COMPLETE METABOLIC PANEL WITH GFR - Lipid panel - CBC with Differential/Platelet - Hemoglobin A1c  2. Prostate cancer screening Checking PSA today. - PSA  3. Encounter for screening for HIV/hepatitis C Discussed screening  recommendations.  Patient is agreeable so we will add this to blood work today. - HIV Antibody (routine testing w rflx) - Hepatitis C antibody  4. S/P gastric sleeve procedure Since his gastric sleeve procedure, he does require routine checks of his vitamins and minerals.  Checking iron panel, vitamin B12, vitamin D, thyroid panel, and magnesium today.  We will also check hemoglobin A1c. - Fe+TIBC+Fer - Vitamin B12 - VITAMIN D 25 Hydroxy (Vit-D Deficiency, Fractures) - Thyroid Panel With TSH - Magnesium - Hemoglobin A1c  5.  Leg cramps  Discussed various reasons and causes for nocturnal leg cramps.  We will check his electrolytes and make sure there is nothing out of the norm there.  Possibly related to posture and movement modifications when back pain is severe.  6. Colon cancer screening Referring to GI for colonoscopy. - Ambulatory referral to Gastroenterology   Orders Placed This Encounter  Procedures  . Hepatitis C antibody  . HIV Antibody (routine testing w rflx)  . COMPLETE METABOLIC PANEL WITH GFR  . Lipid panel  . Fe+TIBC+Fer  . PSA  . Vitamin B12  . VITAMIN  D 25 Hydroxy (Vit-D Deficiency, Fractures)  . Thyroid Panel With TSH  . Magnesium  . CBC with Differential/Platelet  . Hemoglobin A1c  . Ambulatory referral to Gastroenterology    No orders of the defined types were placed in this encounter.   Patient Instructions  Preventive Care 77-71 Years Old, Male Preventive care refers to lifestyle choices and visits with your health care provider that can promote health and wellness. This includes:  A yearly physical exam. This is also called an annual well check.  Regular dental and eye exams.  Immunizations.  Screening for certain conditions.  Healthy lifestyle choices, such as eating a healthy diet, getting regular exercise, not using drugs or products that contain nicotine and tobacco, and limiting alcohol use. What can I expect for my preventive care  visit? Physical exam Your health care provider will check:  Height and weight. These may be used to calculate body mass index (BMI), which is a measurement that tells if you are at a healthy weight.  Heart rate and blood pressure.  Your skin for abnormal spots. Counseling Your health care provider may ask you questions about:  Alcohol, tobacco, and drug use.  Emotional well-being.  Home and relationship well-being.  Sexual activity.  Eating habits.  Work and work Statistician. What immunizations do I need?  Influenza (flu) vaccine  This is recommended every year. Tetanus, diphtheria, and pertussis (Tdap) vaccine  You may need a Td booster every 10 years. Varicella (chickenpox) vaccine  You may need this vaccine if you have not already been vaccinated. Zoster (shingles) vaccine  You may need this after age 59. Measles, mumps, and rubella (MMR) vaccine  You may need at least one dose of MMR if you were born in 1957 or later. You may also need a second dose. Pneumococcal conjugate (PCV13) vaccine  You may need this if you have certain conditions and were not previously vaccinated. Pneumococcal polysaccharide (PPSV23) vaccine  You may need one or two doses if you smoke cigarettes or if you have certain conditions. Meningococcal conjugate (MenACWY) vaccine  You may need this if you have certain conditions. Hepatitis A vaccine  You may need this if you have certain conditions or if you travel or work in places where you may be exposed to hepatitis A. Hepatitis B vaccine  You may need this if you have certain conditions or if you travel or work in places where you may be exposed to hepatitis B. Haemophilus influenzae type b (Hib) vaccine  You may need this if you have certain risk factors. Human papillomavirus (HPV) vaccine  If recommended by your health care provider, you may need three doses over 6 months. You may receive vaccines as individual doses or as more  than one vaccine together in one shot (combination vaccines). Talk with your health care provider about the risks and benefits of combination vaccines. What tests do I need? Blood tests  Lipid and cholesterol levels. These may be checked every 5 years, or more frequently if you are over 60 years old.  Hepatitis C test.  Hepatitis B test. Screening  Lung cancer screening. You may have this screening every year starting at age 110 if you have a 30-pack-year history of smoking and currently smoke or have quit within the past 15 years.  Prostate cancer screening. Recommendations will vary depending on your family history and other risks.  Colorectal cancer screening. All adults should have this screening starting at age 41 and continuing until age 65.  Your health care provider may recommend screening at age 57 if you are at increased risk. You will have tests every 1-10 years, depending on your results and the type of screening test.  Diabetes screening. This is done by checking your blood sugar (glucose) after you have not eaten for a while (fasting). You may have this done every 1-3 years.  Sexually transmitted disease (STD) testing. Follow these instructions at home: Eating and drinking  Eat a diet that includes fresh fruits and vegetables, whole grains, lean protein, and low-fat dairy products.  Take vitamin and mineral supplements as recommended by your health care provider.  Do not drink alcohol if your health care provider tells you not to drink.  If you drink alcohol: ? Limit how much you have to 0-2 drinks a day. ? Be aware of how much alcohol is in your drink. In the [place] one drink equals one 12 oz bottle of beer (355 mL), one 5 oz glass of wine (148 mL), or one 1 oz glass of hard liquor (44 mL). Lifestyle  Take daily care of your teeth and gums.  Stay active. Exercise for at least 30 minutes on 5 or more days each week.  Do not use any products that contain nicotine or  tobacco, such as cigarettes, e-cigarettes, and chewing tobacco. If you need help quitting, ask your health care provider.  If you are sexually active, practice safe sex. Use a condom or other form of protection to prevent STIs (sexually transmitted infections).  Talk with your health care provider about taking a low-dose aspirin every day starting at age 48. What's next?  Go to your health care provider once a year for a well check visit.  Ask your health care provider how often you should have your eyes and teeth checked.  Stay up to date on all vaccines. This information is not intended to replace advice given to you by your health care provider. Make sure you discuss any questions you have with your health care provider. Document Revised: 09/07/2018 Document Reviewed: 09/07/2018 Elsevier Patient Education  [employer].   Follow-up plan: Return in about 3 months (around 11/14/2020) for ADHD follow up.  Clearnce Sorrel, DNP, APRN, FNP-BC [facility]

## 2020-08-14 ENCOUNTER — Other Ambulatory Visit: Payer: Self-pay

## 2020-08-14 ENCOUNTER — Encounter: Payer: Self-pay | Admitting: Medical-Surgical

## 2020-08-14 ENCOUNTER — Ambulatory Visit (INDEPENDENT_AMBULATORY_CARE_PROVIDER_SITE_OTHER): Payer: 59 | Admitting: Medical-Surgical

## 2020-08-14 VITALS — BP 107/70 | HR 72 | Temp 98.5°F | Ht 70.5 in | Wt 261.0 lb

## 2020-08-14 DIAGNOSIS — Z125 Encounter for screening for malignant neoplasm of prostate: Secondary | ICD-10-CM | POA: Diagnosis not present

## 2020-08-14 DIAGNOSIS — H547 Unspecified visual loss: Secondary | ICD-10-CM

## 2020-08-14 DIAGNOSIS — Z1159 Encounter for screening for other viral diseases: Secondary | ICD-10-CM | POA: Diagnosis not present

## 2020-08-14 DIAGNOSIS — Z1211 Encounter for screening for malignant neoplasm of colon: Secondary | ICD-10-CM | POA: Diagnosis not present

## 2020-08-14 DIAGNOSIS — Z903 Acquired absence of stomach [part of]: Secondary | ICD-10-CM | POA: Diagnosis not present

## 2020-08-14 DIAGNOSIS — Z114 Encounter for screening for human immunodeficiency virus [HIV]: Secondary | ICD-10-CM | POA: Diagnosis not present

## 2020-08-14 DIAGNOSIS — Z0000 Encounter for general adult medical examination without abnormal findings: Secondary | ICD-10-CM | POA: Diagnosis not present

## 2020-08-16 ENCOUNTER — Ambulatory Visit (INDEPENDENT_AMBULATORY_CARE_PROVIDER_SITE_OTHER): Payer: 59

## 2020-08-16 ENCOUNTER — Other Ambulatory Visit: Payer: Self-pay

## 2020-08-16 DIAGNOSIS — M19012 Primary osteoarthritis, left shoulder: Secondary | ICD-10-CM | POA: Diagnosis not present

## 2020-08-16 DIAGNOSIS — M25512 Pain in left shoulder: Secondary | ICD-10-CM

## 2020-08-16 DIAGNOSIS — S4992XD Unspecified injury of left shoulder and upper arm, subsequent encounter: Secondary | ICD-10-CM

## 2020-08-16 DIAGNOSIS — G8929 Other chronic pain: Secondary | ICD-10-CM | POA: Diagnosis not present
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
## 2020-08-18 ENCOUNTER — Encounter: Payer: Self-pay | Admitting: Gastroenterology

## 2020-09-02 ENCOUNTER — Encounter: Payer: Self-pay | Admitting: Medical-Surgical

## 2020-09-03 ENCOUNTER — Other Ambulatory Visit: Payer: Self-pay | Admitting: Medical-Surgical

## 2020-09-03 DIAGNOSIS — Z114 Encounter for screening for human immunodeficiency virus [HIV]: Secondary | ICD-10-CM | POA: Diagnosis not present

## 2020-09-03 DIAGNOSIS — Z0000 Encounter for general adult medical examination without abnormal findings: Secondary | ICD-10-CM | POA: Diagnosis not present

## 2020-09-03 DIAGNOSIS — S4992XD Unspecified injury of left shoulder and upper arm, subsequent encounter: Secondary | ICD-10-CM

## 2020-09-03 DIAGNOSIS — Z903 Acquired absence of stomach [part of]: Secondary | ICD-10-CM | POA: Diagnosis not present

## 2020-09-03 DIAGNOSIS — Z1159 Encounter for screening for other viral diseases: Secondary | ICD-10-CM | POA: Diagnosis not present

## 2020-09-03 DIAGNOSIS — H04123 Dry eye syndrome of bilateral lacrimal glands: Secondary | ICD-10-CM | POA: Diagnosis not present

## 2020-09-03 DIAGNOSIS — H524 Presbyopia: Secondary | ICD-10-CM | POA: Diagnosis not present

## 2020-09-03 DIAGNOSIS — Z125 Encounter for screening for malignant neoplasm of prostate: Secondary | ICD-10-CM | POA: Diagnosis not present

## 2020-09-04 LAB — CBC WITH DIFFERENTIAL/PLATELET
Absolute Monocytes: 450 cells/uL (ref 200–950)
Basophils Absolute: 20 cells/uL (ref 0–200)
Basophils Relative: 0.4 %
Eosinophils Absolute: 140 cells/uL (ref 15–500)
Eosinophils Relative: 2.8 %
HCT: 43.9 % (ref 38.5–50.0)
Hemoglobin: 15.1 g/dL (ref 13.2–17.1)
Lymphs Abs: 1200 cells/uL (ref 850–3900)
MCH: 31.6 pg (ref 27.0–33.0)
MCHC: 34.4 g/dL (ref 32.0–36.0)
MCV: 91.8 fL (ref 80.0–100.0)
MPV: 12.1 fL (ref 7.5–12.5)
Monocytes Relative: 9 %
Neutro Abs: 3190 cells/uL (ref 1500–7800)
Neutrophils Relative %: 63.8 %
Platelets: 158 10*3/uL (ref 140–400)
RBC: 4.78 10*6/uL (ref 4.20–5.80)
RDW: 11.8 % (ref 11.0–15.0)
Total Lymphocyte: 24 %
WBC: 5 10*3/uL (ref 3.8–10.8)

## 2020-09-04 LAB — HEMOGLOBIN A1C
Hgb A1c MFr Bld: 5.1 % of total Hgb (ref <5.7)
Mean Plasma Glucose: 100 mg/dL
eAG (mmol/L): 5.5 mmol/L

## 2020-09-04 LAB — PSA: PSA: 0.62 ng/mL (ref <=4.0)

## 2020-09-04 LAB — LIPID PANEL
Cholesterol: 253 mg/dL — ABNORMAL HIGH (ref <200)
HDL: 71 mg/dL (ref >=40)
LDL Cholesterol (Calc): 158 mg/dL (calc) — ABNORMAL HIGH
Non-HDL Cholesterol (Calc): 182 mg/dL (calc) — ABNORMAL HIGH (ref <130)
Total CHOL/HDL Ratio: 3.6 (calc) (ref <5.0)
Triglycerides: 119 mg/dL (ref <150)

## 2020-09-04 LAB — THYROID PANEL WITH TSH
Free Thyroxine Index: 1.9 (ref 1.4–3.8)
T3 Uptake: 32 % (ref 22–35)
T4, Total: 6 ug/dL (ref 4.9–10.5)
TSH: 2.75 mIU/L (ref 0.40–4.50)

## 2020-09-04 LAB — COMPLETE METABOLIC PANEL WITH GFR
AG Ratio: 1.4 (calc) (ref 1.0–2.5)
ALT: 21 U/L (ref 9–46)
AST: 23 U/L (ref 10–35)
Albumin: 4.2 g/dL (ref 3.6–5.1)
Alkaline phosphatase (APISO): 61 U/L (ref 35–144)
BUN: 14 mg/dL (ref 7–25)
CO2: 28 mmol/L (ref 20–32)
Calcium: 9.4 mg/dL (ref 8.6–10.3)
Chloride: 104 mmol/L (ref 98–110)
Creat: 0.91 mg/dL (ref 0.70–1.33)
GFR, Est African American: 113 mL/min/{1.73_m2} (ref >=60)
GFR, Est Non African American: 97 mL/min/{1.73_m2} (ref >=60)
Globulin: 3.1 g/dL (calc) (ref 1.9–3.7)
Glucose, Bld: 86 mg/dL (ref 65–99)
Potassium: 4.2 mmol/L (ref 3.5–5.3)
Sodium: 139 mmol/L (ref 135–146)
Total Bilirubin: 0.8 mg/dL (ref 0.2–1.2)
Total Protein: 7.3 g/dL (ref 6.1–8.1)

## 2020-09-04 LAB — IRON,TIBC AND FERRITIN PANEL
%SAT: 45 % (calc) (ref 20–48)
Ferritin: 24 ng/mL — ABNORMAL LOW (ref 38–380)
Iron: 176 ug/dL (ref 50–180)
TIBC: 395 mcg/dL (calc) (ref 250–425)

## 2020-09-04 LAB — HEPATITIS C ANTIBODY
Hepatitis C Ab: NONREACTIVE
SIGNAL TO CUT-OFF: 0.01 (ref <1.00)

## 2020-09-04 LAB — VITAMIN D 25 HYDROXY (VIT D DEFICIENCY, FRACTURES): Vit D, 25-Hydroxy: 44 ng/mL (ref 30–100)

## 2020-09-04 LAB — HIV ANTIBODY (ROUTINE TESTING W REFLEX): HIV 1&2 Ab, 4th Generation: NONREACTIVE

## 2020-09-04 LAB — MAGNESIUM: Magnesium: 2 mg/dL (ref 1.5–2.5)

## 2020-09-29 MED ORDER — CLONAZEPAM 1 MG PO TABS: 1.0000 mg | ORAL_TABLET | Freq: Two times a day (BID) | ORAL | 0 refills | Status: DC | PRN

## 2020-10-01 ENCOUNTER — Encounter: Payer: Self-pay | Admitting: Gastroenterology

## 2020-10-01 ENCOUNTER — Other Ambulatory Visit: Payer: 59

## 2020-10-01 ENCOUNTER — Other Ambulatory Visit (INDEPENDENT_AMBULATORY_CARE_PROVIDER_SITE_OTHER): Payer: 59

## 2020-10-01 ENCOUNTER — Ambulatory Visit: Payer: 59 | Admitting: Gastroenterology

## 2020-10-01 ENCOUNTER — Other Ambulatory Visit: Payer: Self-pay

## 2020-10-01 DIAGNOSIS — Z860100 Personal history of colon polyps, unspecified: Secondary | ICD-10-CM

## 2020-10-01 DIAGNOSIS — K449 Diaphragmatic hernia without obstruction or gangrene: Secondary | ICD-10-CM

## 2020-10-01 DIAGNOSIS — R197 Diarrhea, unspecified: Secondary | ICD-10-CM

## 2020-10-01 DIAGNOSIS — Z903 Acquired absence of stomach [part of]: Secondary | ICD-10-CM

## 2020-10-01 NOTE — Patient Instructions (Signed)
If you are age 52 or older, your body mass index should be between 23-30. Your Body mass index is 38.13 kg/m. If this is out of the aforementioned range listed, please consider follow up with your Primary Care Provider.  If you are age 81 or younger, your body mass index should be between 19-25. Your Body mass index is 38.13 kg/m. If this is out of the aformentioned range listed, please consider follow up with your Primary Care Provider.   It has been recommended to you by your physician that you have a(n) EGD/Colonoscopy completed. Per your request, we did not schedule the procedure(s) today. Please contact our office at [phone] should you decide to have the procedure completed.   Please go to the second floor of this building [address] to schedule labwork.  Due to recent changes in healthcare laws, you may see the results of your imaging and laboratory studies on MyChart before your provider has had a chance to review them.  We understand that in some cases there may be results that are confusing or concerning to you. Not all laboratory results come back in the same time frame and the provider may be waiting for multiple results in order to interpret others.  Please give [place] 48 hours in order for your provider to thoroughly review all the results before contacting the office for clarification of your results.

## 2020-10-01 NOTE — Progress Notes (Addendum)
Chief Complaint: Diarrhea, history of colon polyps   Referring Provider:     Christen Butter, NP   HPI:     Jared Tran is a 50 y.o. male with a history of ADD, anxiety, previous gastric sleeve, delayed sleep phase syndrome, OSA, ccy, referred to the [facility]ology [facility] for evaluation of diarrhea along w/ discussion for colonoscopy due to prior history of colon polyps.  Reports a history of chronic diarrhea dating back to either 2013 at time of sleeve gastrectomy or 2012 at time of cholecystectomy.  Prior to that, at least 15-year history of various GI symptoms, to include GERD, intermittent loose stools.  He reports having 1-2 loose stool w/ urgency and explosive diarrhea most mornings after breakfast. Has had episodes of urge fecal incotinence. No hematochezia or melena.  Has trialed Imodium with improvement.  In 11/2018 was 189#, now 270# (post op goal <240#) due to reduce exercise during pandemic.   Reports first colonoscopy at age 34 and has had colonoscopy every 5 years since then due to history of colon polyps. Last was age 61 and no polyps that he recalls.   Seen by Dr. Merri Brunette at [facility] in 09/2016 for evaluation of chronic diarrhea.  Was prescribed Questran with improvement but subsequently stopped taking.  Prior to that was followed by Dr. Gwenevere Abbot. Had also used Victoza in the past with improvement, but became cost prohibitive with insurance change.   Separately, had a long history of GERD, treated with multiple high dose PPI, H2RA, antacids, etc until vertical gastric sleeve in 2013.   Does have a history of mixed hiatal and paraesophageal hernia on UGI series in 2014, performed following sleeve gastrectomy.  Labs from 09/03/2020 reviewed and notable for ferritin 24 otherwise normal iron panel, normal CBC, CMP, vitamin D, TSH, HCV, A1c. Restarted ferrous sulfate which she has taken intermittently for years since surgery.   Endoscopic History: -Has  undergone multiple EGDs in the past along with multiple colonoscopies as above.  No reports available for review.   Past Medical History:  Diagnosis Date  . ADD (attention deficit disorder)   . Anxiety   . Arthritis   . Delayed sleep phase syndrome   . Hiatal hernia   . History of colon polyps   . Presbyopia   . Sleep apnea      Past Surgical History:  Procedure Laterality Date  . ARTHROSCOPIC REPAIR ACL    . BACK SURGERY    . CHOLECYSTECTOMY    . COLONOSCOPY     Last one age [age]  . ESOPHAGOGASTRODUODENOSCOPY     since age 89 last one was in 2010 with Dr Aura Camps  . KNEE SURGERY    . LAPAROSCOPIC GASTRIC BAND REMOVAL WITH LAPAROSCOPIC GASTRIC SLEEVE RESECTION    . SHOULDER SURGERY     Family History  Problem Relation Age of Onset  . Deep vein thrombosis Father   . Pulmonary embolism Father   . Seizures Father   . Sleep apnea Father   . Diabetes Paternal Uncle   . Alzheimer's disease Maternal Grandmother   . Skin cancer Paternal Grandmother   . Alzheimer's disease Paternal Grandmother   . Heart attack Paternal Grandfather   . Colon cancer Neg Hx   . Esophageal cancer Neg Hx    Social History   Tobacco Use  . Smoking status: Former Smoker    Quit date: 08/01/1999    Years  since quitting: 21.1  . Smokeless tobacco: Never Used  Vaping Use  . Vaping Use: Never used  Substance Use Topics  . Alcohol use: Yes    Comment: Occasionally  . Drug use: Never   Current Outpatient Medications  Medication Sig Dispense Refill  . amphetamine-dextroamphetamine (ADDERALL XR) 10 MG 24 hr capsule Take 10 mg by mouth daily.    . calcium-vitamin D (OSCAL WITH D) 500-200 MG-UNIT TABS tablet Take 1 tablet by mouth daily.    . clonazePAM (KLONOPIN) 1 MG tablet Take 1 tablet (1 mg total) by mouth 2 (two) times daily as needed for anxiety. 60 tablet 0  . Cyanocobalamin (VITAMIN B-12) 5000 MCG TBDP Take 1 tablet by mouth daily.    . diphenhydrAMINE HCl (ZZZQUIL) 50 MG/30ML  LIQD Take 60 mLs by mouth at bedtime.    . Ferrous Sulfate (IRON PO) Take 1 tablet by mouth daily.    Marland Kitchen ibuprofen (ADVIL) 800 MG tablet Take 1 tablet by mouth daily as needed.    . lidocaine (LMX) 4 % cream Apply 1 application topically daily as needed.    . MULTIPLE VITAMIN PO Take 1 tablet by mouth 2 (two) times daily.     No current facility-administered medications for this visit.   Allergies  Allergen Reactions  . Penicillins Hives and Rash  . Hydrocodone-Acetaminophen Itching and Nausea Only     Review of Systems: All systems reviewed and negative except where noted in HPI.     Physical Exam:    Wt Readings from Last 3 Encounters:  10/01/20 273 lb 6 oz (124 kg)  08/14/20 261 lb (118.4 kg)  07/31/20 259 lb 11.2 oz (117.8 kg)    BP 116/66   Pulse 81   Ht 5\' 11"  (1.803 m)   Wt 273 lb 6 oz (124 kg)   BMI 38.13 kg/m  Constitutional:  Pleasant, in no acute distress. Psychiatric: Normal mood and affect. Behavior is normal. EENT: Pupils normal.  Conjunctivae are normal. No scleral icterus. Neck supple. No cervical LAD. Cardiovascular: Normal rate, regular rhythm. No edema Pulmonary/chest: Effort normal and breath sounds normal. No wheezing, rales or rhonchi. Abdominal: Soft, nondistended, nontender. Bowel sounds active throughout. There are no masses palpable. No hepatomegaly. Neurological: Alert and oriented to person place and time. Skin: Skin is warm and dry. No rashes noted.   ASSESSMENT AND PLAN;   1) Chronic diarrhea -Discussed DDx to include postoperative diarrhea in the setting of prior ccy and prior gastric sleeve.  Did have good response to trial of Questran in the past and also did well when he was taking Victoza (ADR constipation). -Colonoscopy with random directed biopsies along with EGD with biopsies -Check GI PCR panel, fecal elastase -Pending labs and endoscopic findings, consider retrial of Questran.  If not efficacious, can evaluate getting back on  Victoza or similar agent such as Saxenda in concert with Bariatric surgery  2) History of colon polyps -Colonoscopy for ongoing surveillance -Request records from [facility] for review  3) GERD 4) History of hiatal hernia 5) History of gastric sleeve -Evaluate for grade/severity of hernia and LES laxity along with postoperative anatomy at time of EGD as above -Requesting endoscopic records from [facility]  6) Iron deficiency -Reduced iron stores with low ferritin with otherwise normal iron panel and no anemia.  Likely related to prior surgical history -Continue oral iron -Evaluate for additional mucosal/luminal pathology at time of EGD/colonoscopy as above to include duodenal biopsies  The indications, risks, and  benefits of EGD and colonoscopy were explained to the patient in detail. Risks include but are not limited to bleeding, perforation, adverse reaction to medications, and cardiopulmonary compromise. Sequelae include but are not limited to the possibility of surgery, hositalization, and mortality. The patient verbalized understanding and wished to proceed. All questions answered, referred to scheduler and bowel prep ordered. Further recommendations pending results of the exam.   Addendum: Received previous records from Dr. Duane Lope office [facility]) and notable for the following:  - Colonoscopy (07/20/2004): Small rectal hyperplastic polyp, otherwise normal study - EGD (05/20/2004): Slightly loose gastroesophageal junction with small hiatal hernia and erosive esophagitis (path: Reflux esophagitis, negative for intestinal metaplasia).  Normal stomach and duodenum.  Trialed course of Nexium. - Office appointment (10/01/2009: Heartburn, regurgitation.  Long discussion regarding antireflux surgical options. - EGD (10/31/2009): Benign-appearing esophageal stricture, dilated with 54 French Maloney dilator with mild resistance.  Z-line irregular (path: Chronically inflamed  gastric cardia.  Negative for H. pylori.  No Barrett's esophagus.  No squamous epithelium is present.).  Normal stomach and duodenum   Shellia Cleverly, DO, FACG  10/01/2020, 9:26 AM   Christen Butter, NP

## 2020-10-02 LAB — TISSUE TRANSGLUTAMINASE, IGA: (tTG) Ab, IgA: <1 U/mL

## 2020-10-02 LAB — IGA: Immunoglobulin A: 204 mg/dL (ref 47–310)

## 2020-10-14 ENCOUNTER — Encounter: Payer: Self-pay | Admitting: Medical-Surgical

## 2020-11-10 ENCOUNTER — Other Ambulatory Visit: Payer: Self-pay | Admitting: Medical-Surgical

## 2020-11-13 DIAGNOSIS — F419 Anxiety disorder, unspecified: Secondary | ICD-10-CM | POA: Diagnosis not present

## 2020-11-13 DIAGNOSIS — R41840 Attention and concentration deficit: Secondary | ICD-10-CM | POA: Diagnosis not present

## 2020-11-13 MED ORDER — CLONAZEPAM 1 MG PO TABS: 1.0000 mg | ORAL_TABLET | Freq: Two times a day (BID) | ORAL | 0 refills | Status: DC | PRN

## 2020-11-14 ENCOUNTER — Ambulatory Visit: Payer: 59 | Admitting: Medical-Surgical

## 2020-11-14 ENCOUNTER — Encounter: Payer: Self-pay | Admitting: Medical-Surgical

## 2020-11-14 ENCOUNTER — Other Ambulatory Visit: Payer: Self-pay

## 2020-11-14 VITALS — BP 122/78 | HR 68 | Temp 98.7°F | Ht 71.0 in | Wt 273.5 lb

## 2020-11-14 DIAGNOSIS — E291 Testicular hypofunction: Secondary | ICD-10-CM

## 2020-11-14 DIAGNOSIS — R41840 Attention and concentration deficit: Secondary | ICD-10-CM | POA: Diagnosis not present

## 2020-11-14 NOTE — Progress Notes (Signed)
Subjective:    CC: inattention follow up  HPI: Pleasant 50 year old male presenting today for follow up on inattention. Reports he was able to complete his psychological testing yesterday and that the results were somewhat surprising. Notes that he was told that his symptoms are more consistent with an atypical presentation of OCD instead of ADHD. Even though he doesn't have typical rituals associated with OCD, he does have some hyperfocus and obsessive type behaviors that make the diagnosis fit more closely. He does have some difficulty getting things done at work but notes this is related to hyperfocus on other tasks without intending to spend much time on them. Has taken the Adderall XR 10mg  and notes that it does help but usually takes this just after drinking a Monster energy drink. With the new information obtained from testing, he would like to reevaluate potential treatments for his mental health concerns. He is very knowledgeable regarding his low testosterone levels and the endocrinology surrounding it. He sent a list of medications that are associated with erectile dysfunction in a previous patient message. He reports these medications also affect the [facility] and/or HPA axis of hormones. He would like to avoid these medications or at least try to find the one with the least affect due to his concerns over low testosterone and the associated weight concerns, loss of muscle mass, bone density, etc. He also would like to find a way to address his chronic sleep issues with this in mind. Originally noted that he didn't have time for CBT but after his test results, he is open to counseling as long as it is with a counselor who works specifically with OCD. Per his message, he is open to integrative medicine options as well as some dietary modifications that may help.   I reviewed the past medical history, family history, social history, surgical history, and allergies today and no changes were needed.   Please see the problem list section below in epic for further details.  Past Medical History: Past Medical History:  Diagnosis Date  . ADD (attention deficit disorder)   . Anemia   . Anxiety   . Arthritis   . Asthma   . Delayed sleep phase syndrome   . Elevated cholesterol   . GERD (gastroesophageal reflux disease)   . Hiatal hernia   . History of colon polyps   . History of gallstones   . Obesity   . Presbyopia   . Sleep apnea    Past Surgical History: Past Surgical History:  Procedure Laterality Date  . ARTHROSCOPIC REPAIR ACL    . BACK SURGERY    . CHOLECYSTECTOMY    . COLONOSCOPY     Last one age [age]  . ESOPHAGOGASTRODUODENOSCOPY     since age 65 last one was in 2010 with Dr 2011  . KNEE SURGERY    . LAPAROSCOPIC GASTRIC BAND REMOVAL WITH LAPAROSCOPIC GASTRIC SLEEVE RESECTION    . SHOULDER SURGERY     Social History: Social History   Socioeconomic History  . Marital status: Married    Spouse name: Not on file  . Number of children: 2  . Years of education: Not on file  . Highest education level: Not on file  Occupational History  . Occupation: IT department   Tobacco Use  . Smoking status: Former Smoker    Quit date: 08/01/1999    Years since quitting: 21.3  . Smokeless tobacco: Never Used  Vaping Use  . Vaping Use: Never used  Substance and Sexual Activity  . Alcohol use: Yes    Comment: Occasionally  . Drug use: Never  . Sexual activity: Not Currently    Partners: Female  Other Topics Concern  . Not on file  Social History Narrative  . Not on file   Social Determinants of Health   Financial Resource Strain: Not on file  Food Insecurity: Not on file  Transportation Needs: Not on file  Physical Activity: Not on file  Stress: Not on file  Social Connections: Not on file   Family History: Family History  Problem Relation Age of Onset  . Deep vein thrombosis Father   . Pulmonary embolism Father   . Seizures Father   . Sleep  apnea Father   . Diabetes Paternal Uncle   . Alzheimer's disease Maternal Grandmother   . Skin cancer Paternal Grandmother   . Alzheimer's disease Paternal Grandmother   . Heart attack Paternal Grandfather   . Colon cancer Neg Hx   . Esophageal cancer Neg Hx    Allergies: Allergies  Allergen Reactions  . Penicillins Hives and Rash  . Hydrocodone-Acetaminophen Itching and Nausea Only   Medications: See med rec.  Review of Systems: See HPI for pertinent positives and negatives.   Objective:    General: Well Developed, well nourished, and in no acute distress.  Neuro: Alert and oriented x3.  HEENT: Normocephalic, atraumatic.  Skin: Warm and dry. Cardiac: Regular rate and rhythm.  Respiratory: Not using accessory muscles, speaking in full sentences.   Impression and Recommendations:    1. Inattention Reviewed the list of medications that were sent. There are many psychiatric drugs on there and most are used in first line treatment of anxiety and OCD. Discussed discontinuing Klonopin and that it will require a slow taper since treatment with such high doses for long periods causes physical and psychological dependence. One option for treatment for mood, resistant OCD, and sleep is seroquel. Advised patient to look into Seroquel so that he is knowledgeable about the drug and let me know if he would like to try it. Will have to do investigation regarding a counselor who works specifically with OCD issues. Will also investigate further to determine what medications on the list provided have the least affect on the GNRH/HPA axis. Information discovered will be passed along via MyChart for his feedback and approval of the plan.   2. Male hypogonadism Last testosterone check in 2019 so rechecking today. If still low, can always look into replacement options or get him in with reproductive endocrinology for management.  - Testosterone Total,Free,Bio, Males  Return in about 3 months (around  02/11/2021) for mental health/testosterone follow up. ___________________________________________ Thayer Ohm, DNP, APRN, FNP-BC Primary Care and Sports Medicine [facility] [place]

## 2020-11-26 ENCOUNTER — Encounter: Payer: Self-pay | Admitting: Medical-Surgical

## 2020-12-15 ENCOUNTER — Other Ambulatory Visit: Payer: Self-pay | Admitting: Medical-Surgical

## 2020-12-17 MED ORDER — CLONAZEPAM 1 MG PO TABS
1.0000 mg | ORAL_TABLET | Freq: Two times a day (BID) | ORAL | 0 refills | Status: DC | PRN
Start: 2020-12-17 — End: 2021-01-23

## 2021-01-20 ENCOUNTER — Other Ambulatory Visit: Payer: Self-pay | Admitting: Medical-Surgical

## 2021-01-22 ENCOUNTER — Other Ambulatory Visit: Payer: Self-pay | Admitting: Medical-Surgical

## 2021-02-27 ENCOUNTER — Other Ambulatory Visit: Payer: Self-pay | Admitting: Medical-Surgical

## 2021-04-01 ENCOUNTER — Ambulatory Visit: Payer: 59 | Admitting: Medical-Surgical

## 2021-04-01 ENCOUNTER — Ambulatory Visit (INDEPENDENT_AMBULATORY_CARE_PROVIDER_SITE_OTHER): Payer: 59

## 2021-04-01 ENCOUNTER — Other Ambulatory Visit: Payer: Self-pay

## 2021-04-01 ENCOUNTER — Encounter: Payer: Self-pay | Admitting: Medical-Surgical

## 2021-04-01 VITALS — BP 118/71 | HR 86 | Temp 98.8°F | Ht 71.0 in | Wt 278.4 lb

## 2021-04-01 DIAGNOSIS — M79671 Pain in right foot: Secondary | ICD-10-CM

## 2021-04-01 DIAGNOSIS — M545 Low back pain, unspecified: Secondary | ICD-10-CM

## 2021-04-01 DIAGNOSIS — W010XXA Fall on same level from slipping, tripping and stumbling without subsequent striking against object, initial encounter: Secondary | ICD-10-CM

## 2021-04-01 DIAGNOSIS — G8929 Other chronic pain: Secondary | ICD-10-CM | POA: Diagnosis not present

## 2021-04-01 DIAGNOSIS — M79672 Pain in left foot: Secondary | ICD-10-CM

## 2021-04-01 DIAGNOSIS — M7732 Calcaneal spur, left foot: Secondary | ICD-10-CM | POA: Diagnosis not present

## 2021-04-01 DIAGNOSIS — M7731 Calcaneal spur, right foot: Secondary | ICD-10-CM | POA: Diagnosis not present

## 2021-04-01 IMAGING — DX DG FOOT COMPLETE 3+V*R*
3 series · 3 of 3 positions shown · non-contrast
Comparison: None.

CLINICAL DATA: Bilateral foot pain.  Fell a month ago.

EXAM:
LEFT FOOT - COMPLETE 3+ VIEW; RIGHT FOOT COMPLETE - 3+ VIEW

[foot ap]
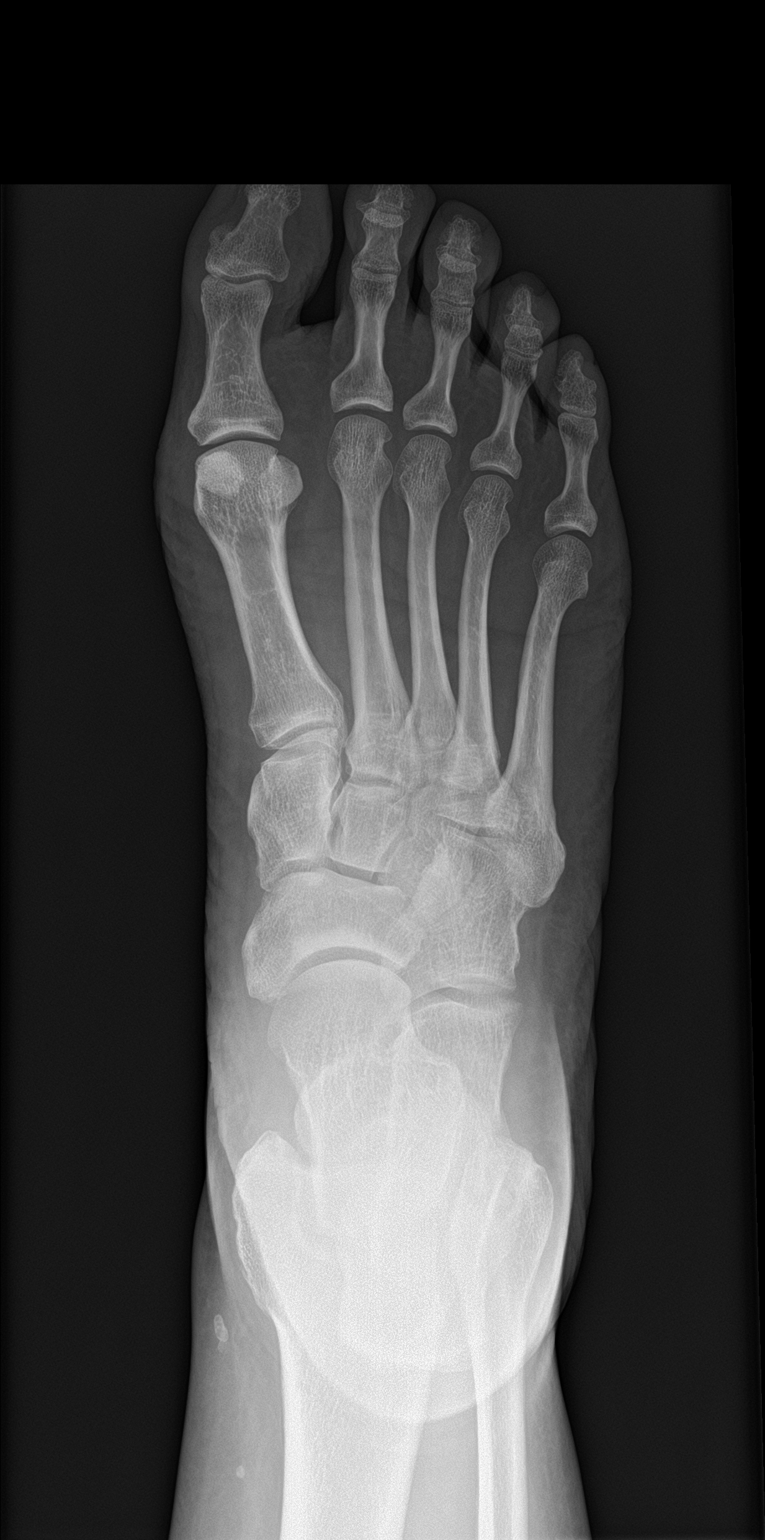

[foot obl]
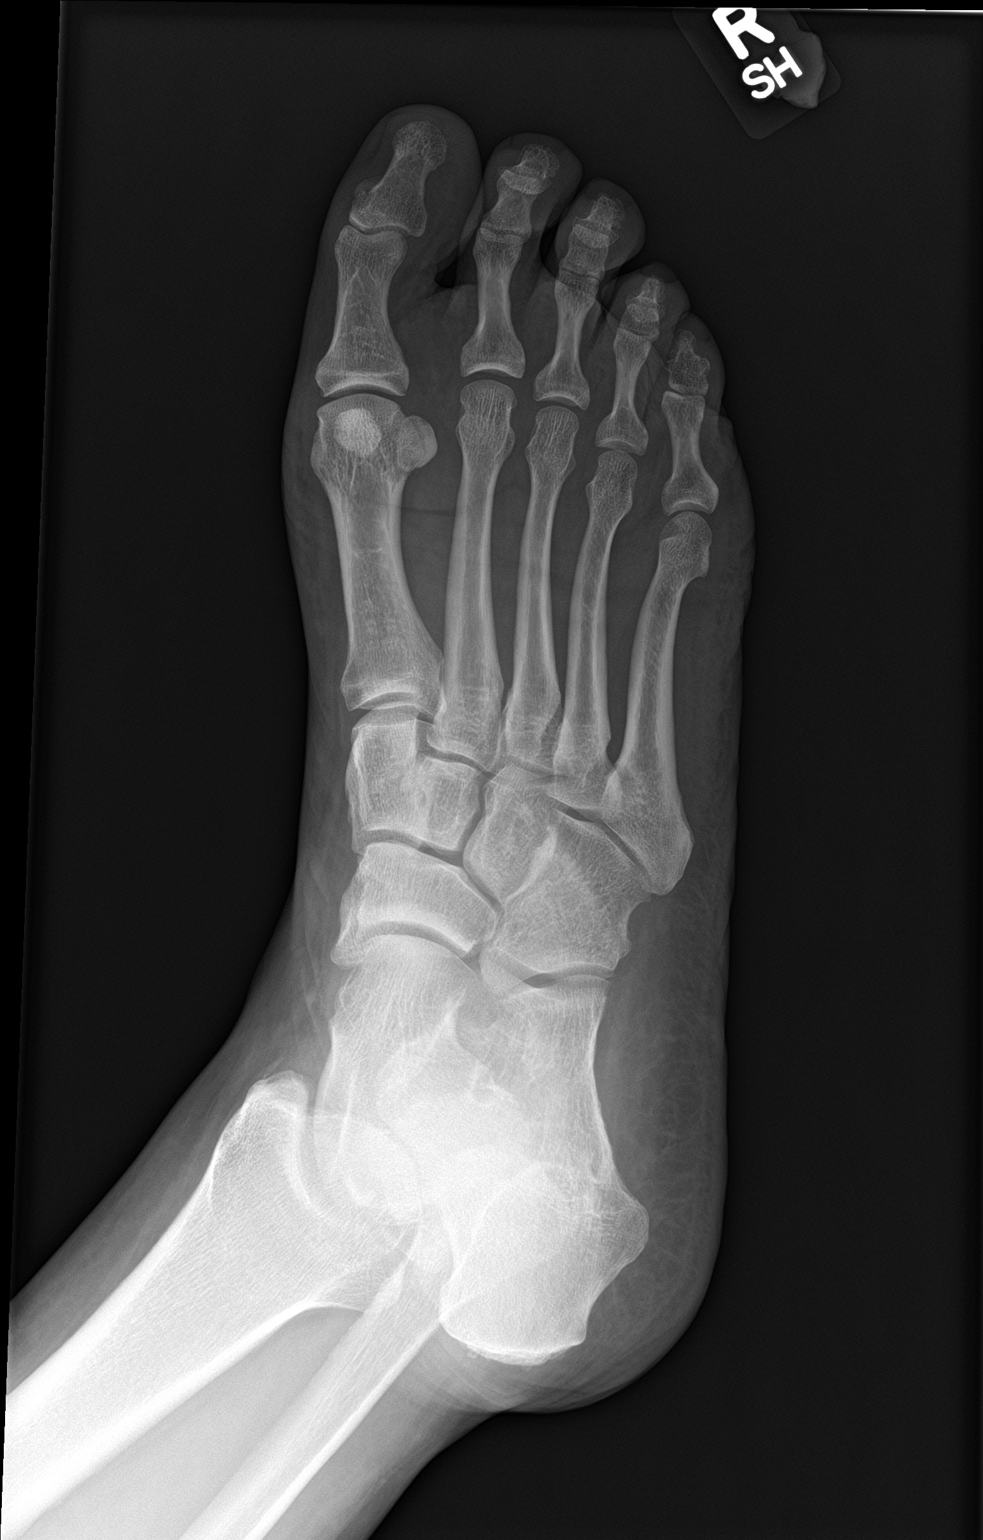

[foot lat]
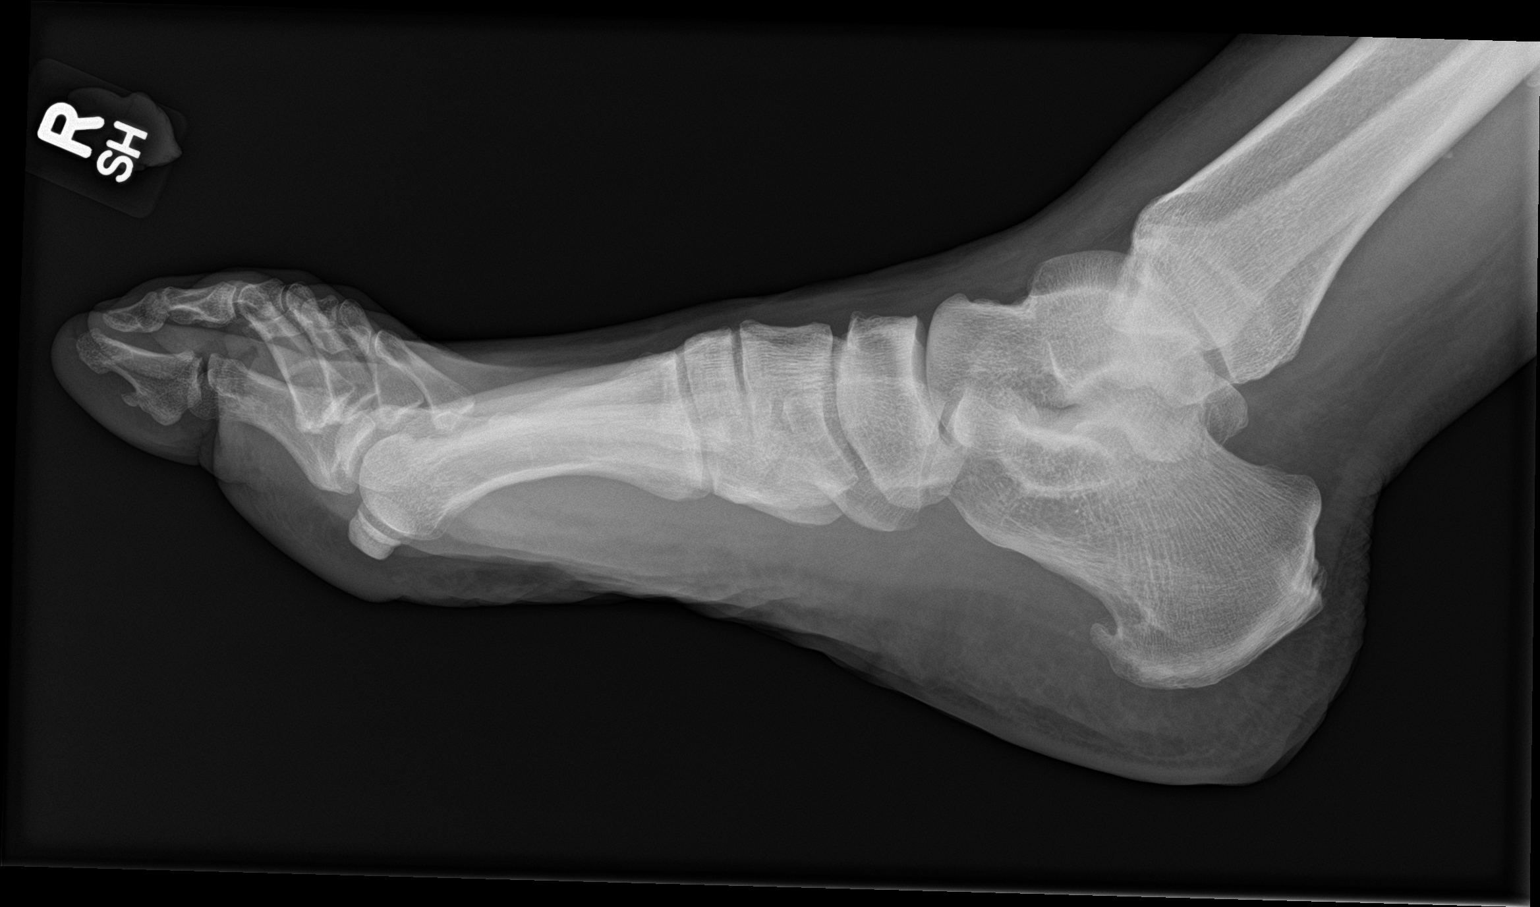

[3 of 3 positions shown; findings below may reference images not displayed]

FINDINGS: The joint spaces are maintained. No acute fracture is identified.
Mild KENSEI is noted. Calcaneal spurring changes are noted.
IMPRESSION: 1. No acute bony findings.
2. Calcaneal spurring changes.

## 2021-04-01 IMAGING — DX DG FOOT COMPLETE 3+V*L*
3 series · 3 of 3 positions shown · non-contrast
Comparison: None.

CLINICAL DATA: Bilateral foot pain.  Fell a month ago.

EXAM:
LEFT FOOT - COMPLETE 3+ VIEW; RIGHT FOOT COMPLETE - 3+ VIEW

[foot ap]
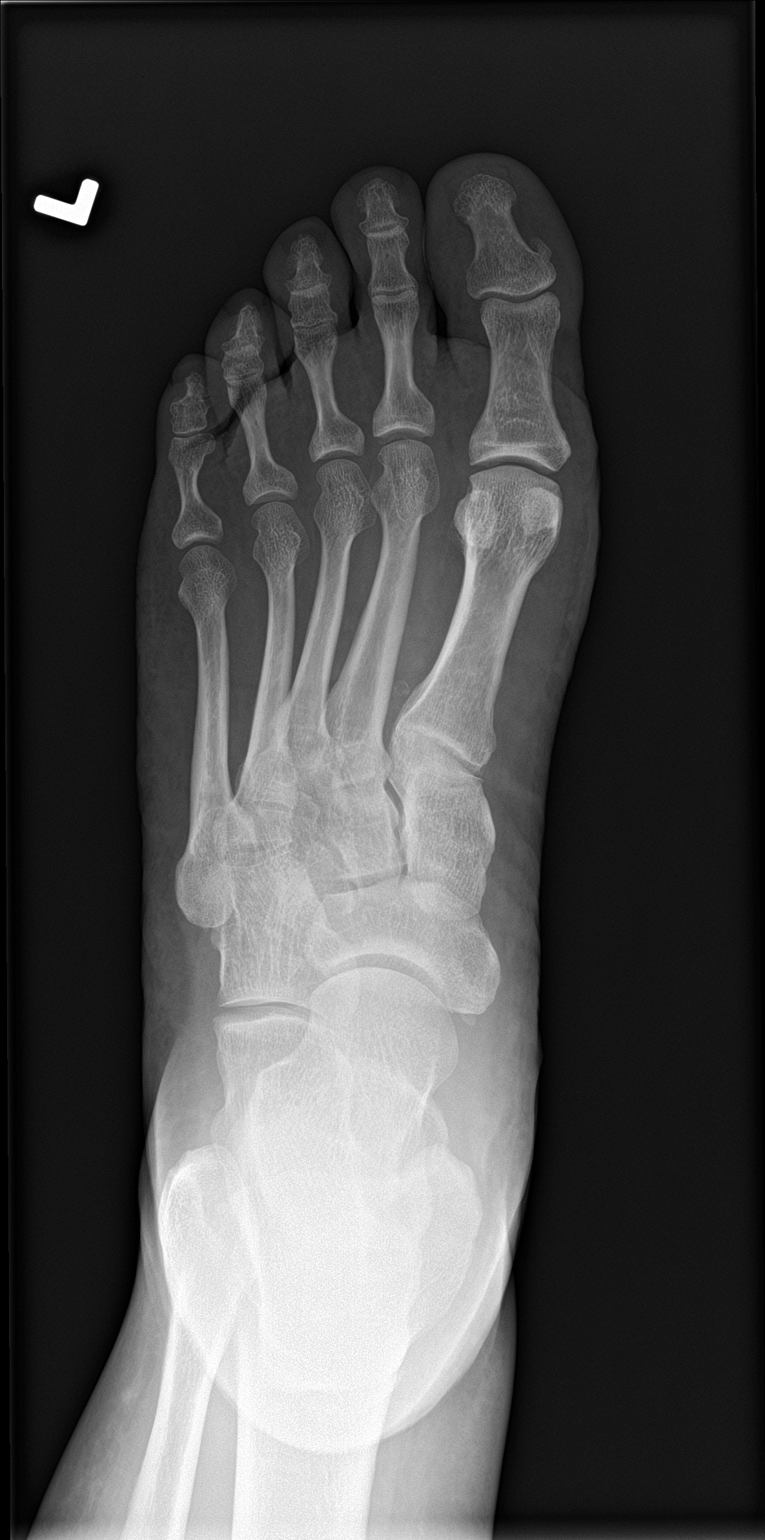

[foot obl]
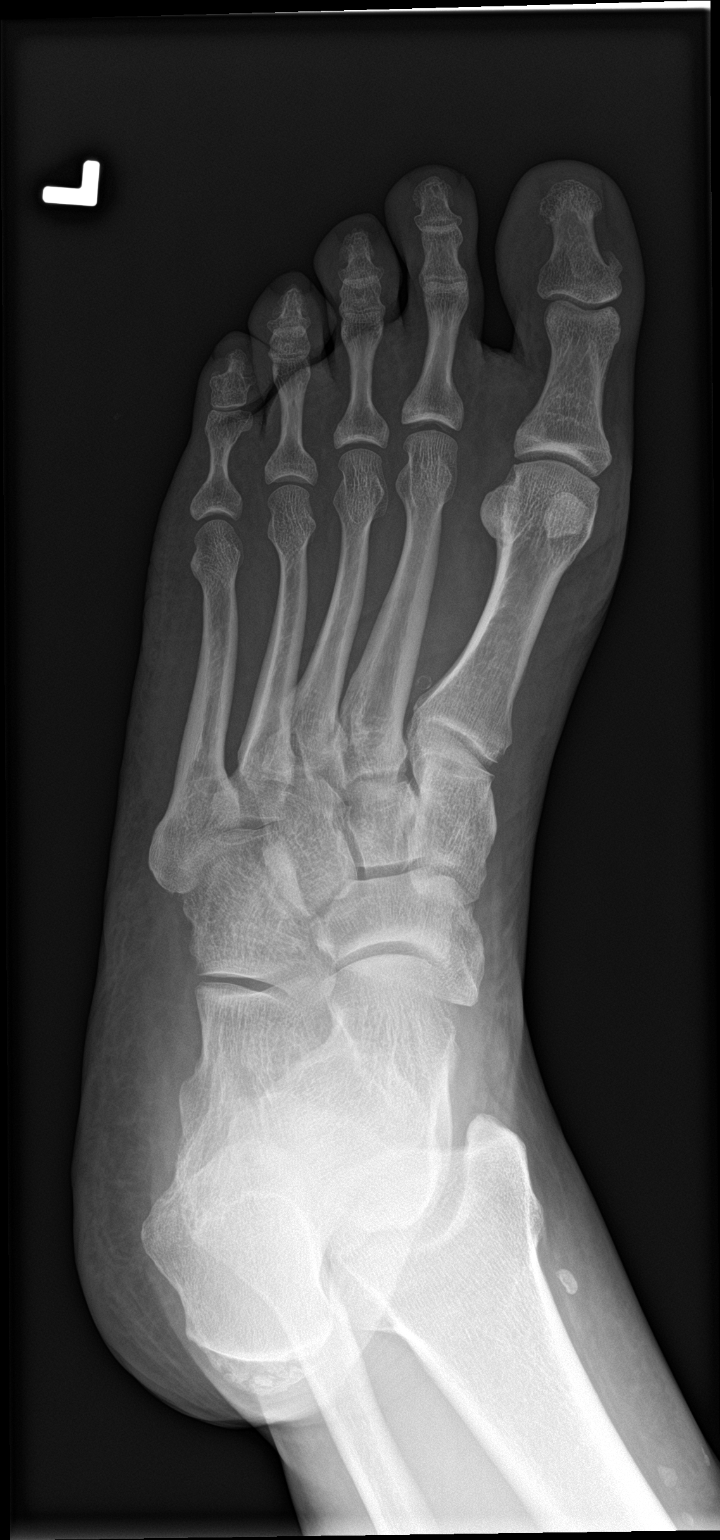

[foot lat]
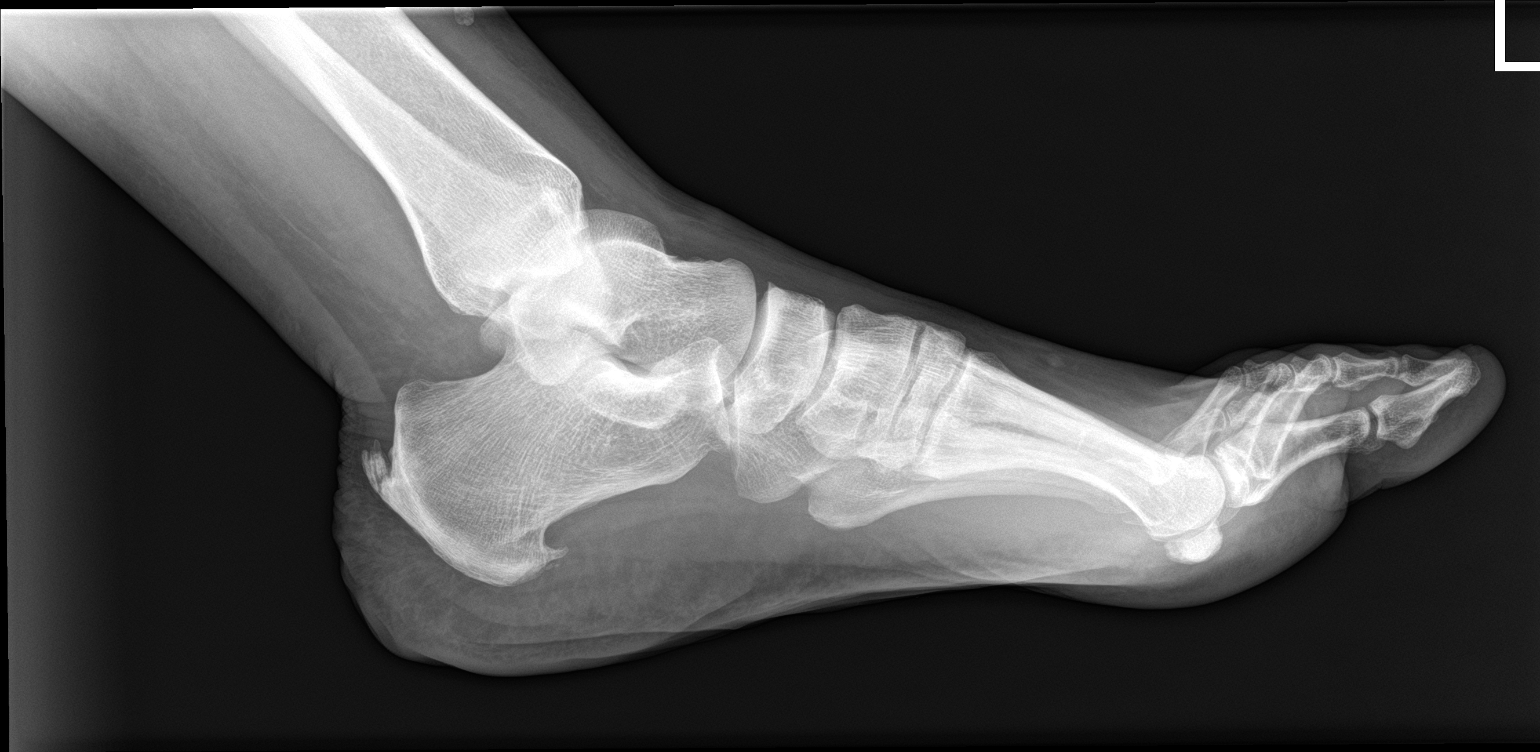

[3 of 3 positions shown; findings below may reference images not displayed]

FINDINGS: The joint spaces are maintained. No acute fracture is identified.
Mild KENSEI is noted. Calcaneal spurring changes are noted.
IMPRESSION: 1. No acute bony findings.
2. Calcaneal spurring changes.

## 2021-04-01 MED ORDER — PREDNISONE 50 MG PO TABS: 50.0000 mg | ORAL_TABLET | Freq: Every day | ORAL | 0 refills | Status: DC

## 2021-04-01 NOTE — Progress Notes (Signed)
Subjective:    CC: foot pain  HPI: Pleasant 50 year old male presenting today with complaints of bilateral foot pain.  He went to the beach approximately 1 month ago and was fishing while standing on the rocks.  He had his dog with him and the leash was attached to his belt loop.  Unfortunately, when his wife came to get him, she came up behind him and startled him with a loud noise.  He slipped while standing on the rocks and fell with his feet going down in between rocks.  He had a large area on his left anterior shin that was severely bruised and painful but this is started to resolve.  He does have an area of hardness in that site but it does look much better than it did.  Since his fall, he has had bilateral foot pain located on the lateral midfoot.  Standing, walking, and activities to require weightbearing are identified as aggravating factors.  He is using ibuprofen 800 mg every 8 hours as needed and has started using topical lidocaine.  Notes his pain is worse when he first gets up from a seated or lying position.  Once he is up and moving, the pain is better but long periods of time do bother him some.  He has not been seen by another provider for this injury.  He does wear supportive shoes regularly.  History of severe low back pain bilaterally without sciatica.  He has had x-rays as well as MRIs.  He was sent to an orthopedic provider who sent him to a spinal surgeon.  Unfortunately, when he arrived at the spinal surgeons office, he reports being told that there was nothing that could be done for him.  He is currently having severe back pain and is interested in pursuing further intervention.  He has had 4-5 epidural procedures completed which were helpful for approximately 6 months each time.  I reviewed the past medical history, family history, social history, surgical history, and allergies today and no changes were needed.  Please see the problem list section below in epic for further  details.  Past Medical History: Past Medical History:  Diagnosis Date   ADD (attention deficit disorder)    Anemia    Anxiety    Arthritis    Asthma    Delayed sleep phase syndrome    Elevated cholesterol    GERD (gastroesophageal reflux disease)    Hiatal hernia    History of colon polyps    History of gallstones    Obesity    Presbyopia    Sleep apnea    Past Surgical History: Past Surgical History:  Procedure Laterality Date   ARTHROSCOPIC REPAIR ACL     BACK SURGERY     CHOLECYSTECTOMY     COLONOSCOPY     Last one age 7 [facility]   ESOPHAGOGASTRODUODENOSCOPY     since age 85 last one was in 2010 with Dr Aura Camps   KNEE SURGERY     LAPAROSCOPIC GASTRIC BAND REMOVAL WITH LAPAROSCOPIC GASTRIC SLEEVE RESECTION     SHOULDER SURGERY     Social History: Social History   Socioeconomic History   Marital status: Married    Spouse name: Not on file   Number of children: 2   Years of education: Not on file   Highest education level: Not on file  Occupational History   Occupation: IT department   Tobacco Use   Smoking status: Former    Pack years: 0.00  Types: Cigarettes    Quit date: 08/01/1999    Years since quitting: 21.6   Smokeless tobacco: Never  Vaping Use   Vaping Use: Never used  Substance and Sexual Activity   Alcohol use: Yes    Comment: Occasionally   Drug use: Never   Sexual activity: Not Currently    Partners: Female  Other Topics Concern   Not on file  Social History Narrative   Not on file   Social Determinants of Health   Financial Resource Strain: Not on file  Food Insecurity: Not on file  Transportation Needs: Not on file  Physical Activity: Not on file  Stress: Not on file  Social Connections: Not on file   Family History: Family History  Problem Relation Age of Onset   Deep vein thrombosis Father    Pulmonary embolism Father    Seizures Father    Sleep apnea Father    Diabetes Paternal Uncle    Alzheimer's disease  Maternal Grandmother    Skin cancer Paternal Grandmother    Alzheimer's disease Paternal Grandmother    Heart attack Paternal Grandfather    Colon cancer Neg Hx    Esophageal cancer Neg Hx    Allergies: Allergies  Allergen Reactions   Penicillins Hives and Rash   Hydrocodone-Acetaminophen Itching and Nausea Only   Medications: See med rec.  Review of Systems: See HPI for pertinent positives and negatives.   Objective:    General: Well Developed, well nourished, and in no acute distress.  Neuro: Alert and oriented x3.  HEENT: Normocephalic, atraumatic.  Skin: Warm and dry. Cardiac: Regular rate and rhythm, no murmurs rubs or gallops, no lower extremity edema.  Respiratory: Clear to auscultation bilaterally. Not using accessory muscles, speaking in full sentences. MSK: Gait steady, tolerating weightbearing well.  Tenderness along the lateral aspect of the midfoot bilaterally.  1+ edema to the ankles bilaterally.  Impression and Recommendations:    1. Bilateral foot pain 2. Fall from slipping on slippery surface, initial encounter Is doing well tolerating ambulation but tenderness on palpation suspicious for possible partially healed fractures.  Getting x-rays of the bilateral feet today.  Since we are doing prednisone for his back pain, this may help with foot pain.  Discussed physical therapy and conservative treatment prior to doing further imaging.  Advised that he may need to see Dr. Karie Schwalbe for possible corticosteroid injections or other treatment recommendations. - DG Foot Complete Left; Future - DG Foot Complete Right; Future  3. Chronic bilateral low back pain without sciatica Recent imaging including MRI available for review in epic under care everywhere.  Since he is having a flare of his low back pain, we will do prednisone 50 mg daily x5 days.  Advised to avoid ibuprofen while taking prednisone but after his 5 days have been completed, okay to resume ibuprofen 800 mg every 8  hours as needed.  Ultimately, I think he would benefit from seeing Dr. Karie Schwalbe for further evaluation and discussion of treatment options. - predniSONE (DELTASONE) 50 MG tablet; Take 1 tablet (50 mg total) by mouth daily.  Dispense: 5 tablet; Refill: 0  Return if symptoms worsen or fail to improve. ___________________________________________ Thayer Ohm, DNP, APRN, FNP-BC Primary Care and Sports Medicine [facility] [place]

## 2021-04-02 ENCOUNTER — Other Ambulatory Visit: Payer: Self-pay | Admitting: Medical-Surgical

## 2021-04-03 ENCOUNTER — Other Ambulatory Visit: Payer: Self-pay | Admitting: Medical-Surgical

## 2021-04-03 MED ORDER — CLONAZEPAM 1 MG PO TABS: 1.0000 mg | ORAL_TABLET | Freq: Two times a day (BID) | ORAL | 2 refills | Status: DC | PRN

## 2021-05-27 ENCOUNTER — Encounter: Payer: Self-pay | Admitting: Family Medicine

## 2021-06-01 ENCOUNTER — Other Ambulatory Visit: Payer: Self-pay

## 2021-06-01 ENCOUNTER — Encounter: Payer: Self-pay | Admitting: Family Medicine

## 2021-06-01 ENCOUNTER — Emergency Department
Admission: EM | Admit: 2021-06-01 | Discharge: 2021-06-01 | Disposition: A | Discharge status: Home / Self Care | Payer: 59 | Source: Home / Self Care

## 2021-06-01 DIAGNOSIS — B349 Viral infection, unspecified: Secondary | ICD-10-CM

## 2021-06-01 DIAGNOSIS — Z20822 Contact with and (suspected) exposure to covid-19: Secondary | ICD-10-CM | POA: Diagnosis not present

## 2021-06-01 LAB — POC SARS CORONAVIRUS 2 AG -  ED: SARS Coronavirus 2 Ag: NEGATIVE

## 2021-06-01 NOTE — Discharge Instructions (Addendum)
Advised patient conservative measures for now may use OTC Tylenol 1000 mg 1-2 times daily, as needed for fever, sore throat pain, and myalgias.  Advised patient we will follow-up with COVID-19 results once PCR COVID-19 is returned.

## 2021-06-01 NOTE — ED Provider Notes (Signed)
[name]    CSN: [id number] Arrival date & time: 06/01/21  1720      History   Chief Complaint Chief Complaint  Patient presents with   Covid Exposure   Cough    HPI Jared Tran is a 50 y.o. male.   HPI 50 year old male presents with exposure to COVID-19 from his daughter, whom we evaluated earlier this morning.  Patient reports his daughter was diagnosed with COVID-19 today.  Patient reports cough, severe sore throat, nasal congestion, and myalgias for 1-2 days.  Past Medical History:  Diagnosis Date   ADD (attention deficit disorder)    Anemia    Anxiety    Arthritis    Asthma    Delayed sleep phase syndrome    Elevated cholesterol    GERD (gastroesophageal reflux disease)    Hiatal hernia    History of colon polyps    History of gallstones    Obesity    Presbyopia    Sleep apnea     Patient Active Problem List   Diagnosis Date Noted   Arthropathy of lumbar facet joint 11/03/2018   SI (sacroiliac) pain 11/03/2018   Lumbar radiculopathy 09/06/2018   Spondylosis of lumbar region without myelopathy or radiculopathy 12/13/2017   OSA on CPAP 01/21/2017   Gastroesophageal reflux disease without esophagitis 10/18/2016   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   Teratospermia 05/31/2013   Bilateral dry eyes 11/17/2012   Congenital anomaly, optic nerve, 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 Authorizing Provider  calcium-vitamin D (OSCAL WITH D) 500-200 MG-UNIT TABS tablet Take 1 tablet by mouth daily.   Yes [provider]  clonazePAM (KLONOPIN) 1 MG tablet Take 1 tablet (1 mg total) by mouth 2 (two) times daily as needed for anxiety. 04/03/21  Yes Christen Butter, NP  Cyanocobalamin (VITAMIN B-12) 5000 MCG TBDP Take 1 tablet by mouth daily.   Yes [provider]  diphenhydrAMINE HCl (ZZZQUIL) 50 MG/30ML LIQD Take 60 mLs by mouth at bedtime.   Yes [provider]  Ferrous Sulfate (IRON PO) Take 1 tablet by mouth daily.   Yes [provider]  ibuprofen (ADVIL) 800 MG tablet Take 1 tablet by mouth daily as needed. 03/16/20  Yes [provider]  lidocaine (LMX) 4 % cream Apply 1 application topically daily as needed.   Yes [provider]  MULTIPLE VITAMIN PO Take 1 tablet by mouth 2 (two) times daily.   Yes [provider]  predniSONE (DELTASONE) 50 MG tablet Take 1 tablet (50 mg total) by mouth daily. 04/01/21   Christen Butter, NP    Family History Family History  Problem Relation Age of Onset   Deep vein thrombosis Father    Pulmonary embolism Father    Seizures Father  Sleep apnea Father    Diabetes Paternal Uncle    Alzheimer's disease Maternal Grandmother    Skin cancer Paternal Grandmother    Alzheimer's disease Paternal Grandmother    Heart attack Paternal Grandfather    Colon cancer Neg Hx    Esophageal cancer Neg Hx     Social History Social History   Tobacco Use   Smoking status: Former    Types: Cigarettes    Quit date: 08/01/1999    Years since quitting: 21.8   Smokeless tobacco: Never  Vaping Use   Vaping Use: Never used  Substance Use Topics   Alcohol use: Yes    Comment: Occasionally   Drug use: Never     Allergies   Penicillins and Hydrocodone-acetaminophen   Review of Systems Review of Systems  HENT:  Positive for sore throat.   Respiratory:  Positive  for cough.   Musculoskeletal:  Positive for myalgias.  All other systems reviewed and are negative.   Physical Exam Triage Vital Signs ED Triage Vitals [06/01/21 1738]  Enc Vitals Group     BP      Pulse      Resp      Temp      Temp src      SpO2      Weight      Height      Head Circumference      Peak Flow      Pain Score 4     Pain Loc      Pain Edu?      Excl. in GC?    No data found.  Updated Vital Signs BP 123/83 (BP Location: Left Arm)   Pulse 85   Temp 99 F (37.2 C) (Oral)   Resp 18   SpO2 98%      Physical Exam Vitals and nursing note reviewed.  Constitutional:      General: He is not in acute distress.    Appearance: Normal appearance. He is obese. He is not ill-appearing.  HENT:     Head: Normocephalic and atraumatic.     Right Ear: Tympanic membrane, ear canal and external ear normal.     Left Ear: Tympanic membrane, ear canal and external ear normal.     Mouth/Throat:     Mouth: Mucous membranes are moist.     Pharynx: Oropharynx is clear.  Eyes:     Extraocular Movements: Extraocular movements intact.     Conjunctiva/sclera: Conjunctivae normal.     Pupils: Pupils are equal, round, and reactive to light.  Cardiovascular:     Rate and Rhythm: Normal rate and regular rhythm.     Pulses: Normal pulses.     Heart sounds: Normal heart sounds.  Pulmonary:     Effort: Pulmonary effort is normal.     Breath sounds: Normal breath sounds. No wheezing, rhonchi or rales.  Musculoskeletal:        General: Normal range of motion.     Cervical back: Normal range of motion and neck supple. No tenderness.  Lymphadenopathy:     Cervical: No cervical adenopathy.  Skin:    General: Skin is warm and dry.  Neurological:     General: No focal deficit present.     Mental Status: He is alert and oriented to person, place, and time. Mental status is at baseline.  Psychiatric:        Mood and Affect: Mood normal.        Behavior: Behavior  normal.         Thought Content: Thought content normal.     UC Treatments / Results  Labs (all labs ordered are listed, but only abnormal results are displayed) Labs Reviewed  POC SARS CORONAVIRUS 2 AG -  ED - Normal  SARS-COV-2 RNA,(COVID-19) QUALITATIVE NAAT    EKG   Radiology No results found.  Procedures Procedures (including critical care time)  Medications Ordered in UC Medications - No data to display  Initial Impression / Assessment and Plan / UC Course  I have reviewed the triage vital signs and the nursing notes.  Pertinent labs & imaging results that were available during my care of the patient were reviewed by me and considered in my medical decision making (see chart for details).     MDM: 1. Contact with and suspected exposure to COVID-19, rapid COVID-19 negative, [facility] COVID-19 PCR ordered; 2.  Viral illness-Advised patient conservative measures for now may use OTC Tylenol 1000 mg 1-2 times daily, as needed for fever, sore throat pain, and myalgias.  Advised patient we will follow-up with COVID-19 results once PCR COVID-19 is returned.  Discharged home, hemodynamically stable. Final Clinical Impressions(s) / UC Diagnoses   Final diagnoses:  Contact with and (suspected) exposure to covid-19  Viral illness     Discharge Instructions      Advised patient conservative measures for now may use OTC Tylenol 1000 mg 1-2 times daily, as needed for fever, sore throat pain, and myalgias.  Advised patient we will follow-up with COVID-19 results once PCR COVID-19 is returned.     ED Prescriptions   None    PDMP not reviewed this encounter.   Cyncere, Sontag, FNP 06/01/21 [place]

## 2021-06-01 NOTE — ED Triage Notes (Signed)
Patient presents to Urgent Care with complaints of exposure of covid from daughter. His daughter was dx with covid today Patient reports cough, severe sore throat, nasal congestion, body ache/neck.

## 2021-06-02 ENCOUNTER — Ambulatory Visit: Payer: 59 | Admitting: Sports Medicine

## 2021-06-02 LAB — SARS-COV-2 RNA,(COVID-19) QUALITATIVE NAAT: SARS CoV2 RNA: NOT DETECTED

## 2021-06-09 ENCOUNTER — Ambulatory Visit (INDEPENDENT_AMBULATORY_CARE_PROVIDER_SITE_OTHER): Payer: 59 | Admitting: Sports Medicine

## 2021-06-09 ENCOUNTER — Other Ambulatory Visit: Payer: Self-pay | Admitting: Sports Medicine

## 2021-06-09 DIAGNOSIS — M79671 Pain in right foot: Secondary | ICD-10-CM

## 2021-06-09 DIAGNOSIS — M47816 Spondylosis without myelopathy or radiculopathy, lumbar region: Secondary | ICD-10-CM | POA: Diagnosis not present

## 2021-06-09 DIAGNOSIS — M79672 Pain in left foot: Secondary | ICD-10-CM | POA: Diagnosis not present

## 2021-06-09 MED ORDER — PREDNISONE 50 MG PO TABS: ORAL_TABLET | ORAL | 0 refills | Status: DC

## 2021-06-09 NOTE — Assessment & Plan Note (Signed)
This is a pleasant 50 year old male, 6 months ago he was fishing, fell, he had immediate pain, swelling, bruising on both feet. Now 6 months later he continues to have pain that localizes directly over the base of the fifth metatarsal, left worse than right. He has tried medications, activity modification without much improvement, x-rays were unremarkable. We agreed to proceed with bilateral foot MRIs. I would also like him to get custom orthotics with Dr. Jordan Likes with lateral wedges.

## 2021-06-09 NOTE — Assessment & Plan Note (Signed)
Jared Tran also has a long history of axial low back pain. It sounds like he had facet radiofrequency ablations that worked very well for 6 to 9 months in the past when he has had it. Looking back it looks like he had bilateral L3-S1 ablations, I will set him up with [facility] imaging for medial branch blocks and bilateral L3-S1 facet radiofrequency ablations, adding some prednisone to hold him over in the meantime.

## 2021-06-09 NOTE — Progress Notes (Signed)
    Procedures performed today:    None.  Independent interpretation of notes and tests performed by another provider:   None.  Brief History, Exam, Impression, and Recommendations:    Bilateral foot pain This is a pleasant 50 year old male, 6 months ago he was fishing, fell, he had immediate pain, swelling, bruising on both feet. Now 6 months later he continues to have pain that localizes directly over the base of the fifth metatarsal, left worse than right. He has tried medications, activity modification without much improvement, x-rays were unremarkable. We agreed to proceed with bilateral foot MRIs. I would also like him to get custom orthotics with Dr. Jordan Tran with lateral wedges.  Arthropathy of lumbar facet joint Jared Tran also has a long history of axial low back pain. It sounds like he had facet radiofrequency ablations that worked very well for 6 to 9 months in the past when he has had it. Looking back it looks like he had bilateral L3-S1 ablations, I will set him up with [facility] imaging for medial branch blocks and bilateral L3-S1 facet radiofrequency ablations, adding some prednisone to hold him over in the meantime.    ___________________________________________ Jared Tran. Jared Tran, M.D., ABFM., CAQSM. Primary Care and Sports Medicine [facility]  Adjunct Instructor of Family Medicine  [employer]

## 2021-06-15 ENCOUNTER — Encounter: Payer: Self-pay | Admitting: Family Medicine

## 2021-06-15 ENCOUNTER — Ambulatory Visit (INDEPENDENT_AMBULATORY_CARE_PROVIDER_SITE_OTHER): Payer: 59 | Admitting: Family Medicine

## 2021-06-15 ENCOUNTER — Other Ambulatory Visit: Payer: Self-pay

## 2021-06-15 DIAGNOSIS — M79672 Pain in left foot: Secondary | ICD-10-CM

## 2021-06-15 DIAGNOSIS — M79671 Pain in right foot: Secondary | ICD-10-CM | POA: Diagnosis not present

## 2021-06-15 NOTE — Progress Notes (Signed)
Jared Tran - 50 y.o. male MRN [id number]  Date of birth: [birth year]  SUBJECTIVE:  Including CC & ROS.  No chief complaint on file.   Jared Tran is a 50 y.o. male that is presenting with bilateral foot pain.  He had a trauma some time ago and is still having the pain..   Review of Systems See HPI   HISTORY: Past Medical, Surgical, Social, and Family History Reviewed & Updated per EMR.   Pertinent Historical Findings include:  Past Medical History:  Diagnosis Date   ADD (attention deficit disorder)    Anemia    Anxiety    Arthritis    Asthma    Delayed sleep phase syndrome    Elevated cholesterol    GERD (gastroesophageal reflux disease)    Hiatal hernia    History of colon polyps    History of gallstones    Obesity    Presbyopia    Sleep apnea     Past Surgical History:  Procedure Laterality Date   ARTHROSCOPIC REPAIR ACL     BACK SURGERY     CHOLECYSTECTOMY     COLONOSCOPY     Last one age 59 [facility]   ESOPHAGOGASTRODUODENOSCOPY     since age 21 last one was in 2010 with Dr Aura Camps   KNEE SURGERY     LAPAROSCOPIC GASTRIC BAND REMOVAL WITH LAPAROSCOPIC GASTRIC SLEEVE RESECTION     SHOULDER SURGERY      Family History  Problem Relation Age of Onset   Deep vein thrombosis Father    Pulmonary embolism Father    Seizures Father    Sleep apnea Father    Diabetes Paternal Uncle    Alzheimer's disease Maternal Grandmother    Skin cancer Paternal Grandmother    Alzheimer's disease Paternal Grandmother    Heart attack Paternal Grandfather    Colon cancer Neg Hx    Esophageal cancer Neg Hx     Social History   Socioeconomic History   Marital status: Married    Spouse name: Not on file   Number of children: 2   Years of education: Not on file   Highest education level: Not on file  Occupational History   Occupation: IT department   Tobacco Use   Smoking status: Former    Types: Cigarettes    Quit date: 08/01/1999    Years since quitting:  21.8   Smokeless tobacco: Never  Vaping Use   Vaping Use: Never used  Substance and Sexual Activity   Alcohol use: Yes    Comment: Occasionally   Drug use: Never   Sexual activity: Not Currently    Partners: Female  Other Topics Concern   Not on file  Social History Narrative   Not on file   Social Determinants of Health   Financial Resource Strain: Not on file  Food Insecurity: Not on file  Transportation Needs: Not on file  Physical Activity: Not on file  Stress: Not on file  Social Connections: Not on file  Intimate Partner Violence: Not on file     PHYSICAL EXAM:  VS: Ht 5\' 11"  (1.803 m)   Wt 278 lb (126.1 kg)   BMI 38.77 kg/m  Physical Exam Gen: NAD, alert, cooperative with exam, well-appearing   Patient was fitted for a standard, cushioned, semi-rigid orthotic. The orthotic was heated and afterward the patient stood on the orthotic blank positioned on the orthotic stand. The patient was positioned in subtalar neutral position and 10 degrees of  ankle dorsiflexion in a weight bearing stance. After completion of molding, a stable base was applied to the orthotic blank. The blank was ground to a stable position for weight bearing. Size: 13 Pairs: 2 Base: Blue EVA Additional Posting and Padding: Lateral heel wedge bilaterally The patient ambulated these, and they were very comfortable.     ASSESSMENT & PLAN:   Bilateral foot pain Having foot pain related to his trauma.  Has tried other modalities with limited improvement thus far. -Counseled on home exercise therapy and supportive care. -Orthotics with lateral heel wedges -Could consider additional padding.

## 2021-06-16 NOTE — Assessment & Plan Note (Signed)
Having foot pain related to his trauma.  Has tried other modalities with limited improvement thus far. -Counseled on home exercise therapy and supportive care. -Orthotics with lateral heel wedges -Could consider additional padding.

## 2021-06-20 ENCOUNTER — Other Ambulatory Visit: Payer: Self-pay

## 2021-06-20 ENCOUNTER — Ambulatory Visit (INDEPENDENT_AMBULATORY_CARE_PROVIDER_SITE_OTHER): Payer: 59

## 2021-06-20 DIAGNOSIS — M79671 Pain in right foot: Secondary | ICD-10-CM | POA: Diagnosis not present

## 2021-06-20 DIAGNOSIS — M79672 Pain in left foot: Secondary | ICD-10-CM

## 2021-06-20 DIAGNOSIS — M19072 Primary osteoarthritis, left ankle and foot: Secondary | ICD-10-CM | POA: Diagnosis not present

## 2021-06-20 IMAGING — MR MR FOOT*R* W/O CM
4 of 5 series · 26 of 40 positions shown · non-contrast
Comparison: None.

CLINICAL DATA: Chronic right foot pain for 3 months.

EXAM:
MRI OF THE RIGHT FOREFOOT WITHOUT CONTRAST
TECHNIQUE: Multiplanar, multisequence MR imaging of the right forefoot was
performed. No intravenous contrast was administered.

[Series 4: T1 · coronal · 3.0mm · 0.23mm/px · 6 of 50 slices shown (1 of 2)]
[im 1/50]
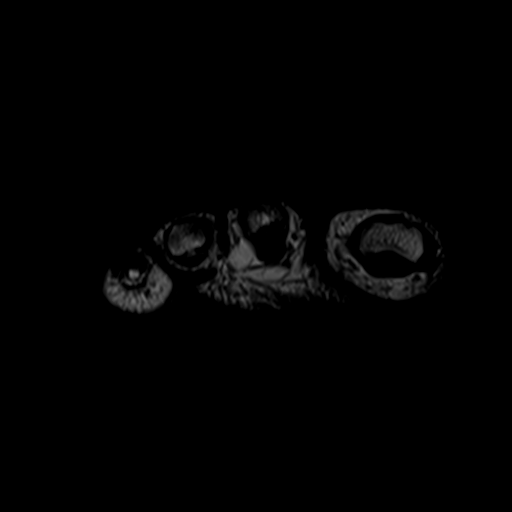
[im 6/50]
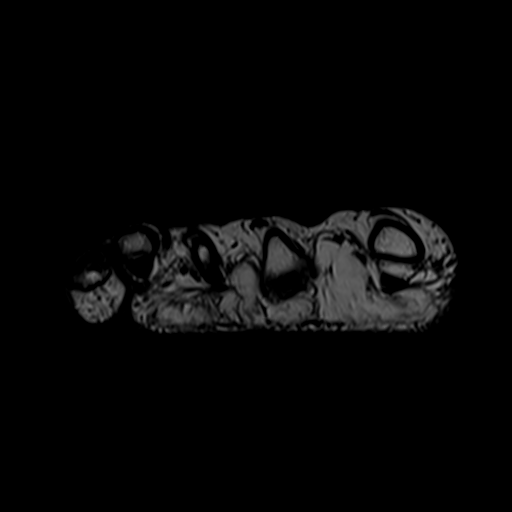
[im 17/50]
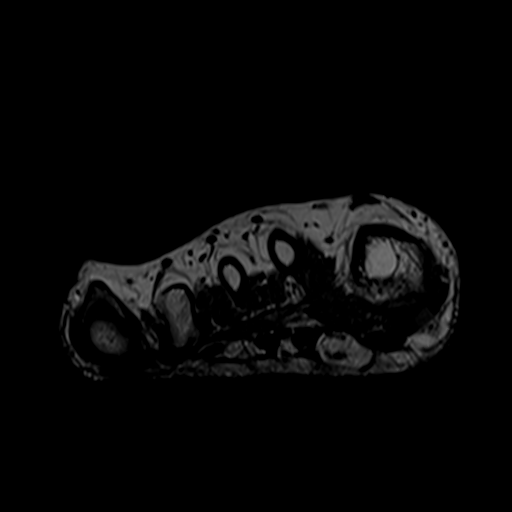
[im 22/50]
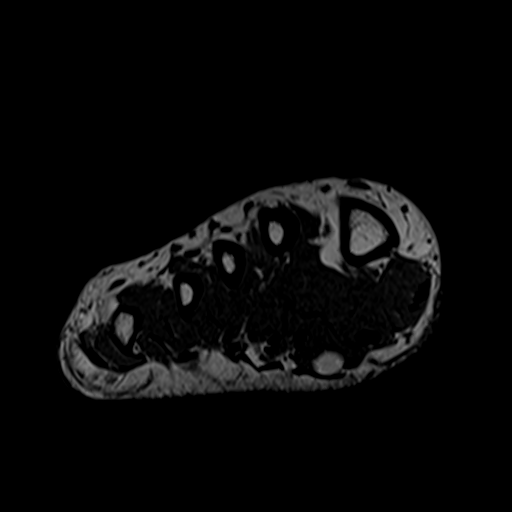
[im 28/50]
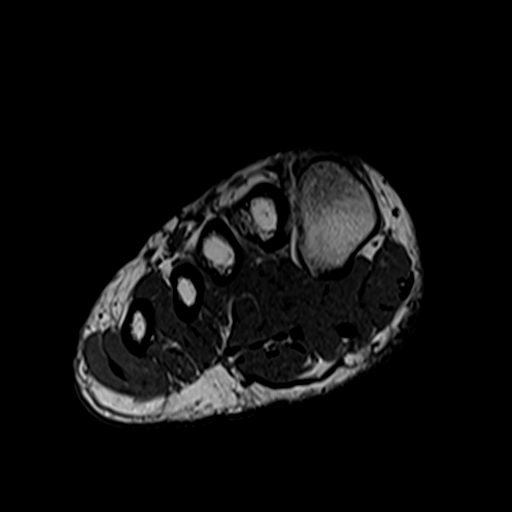
[im 44/50]
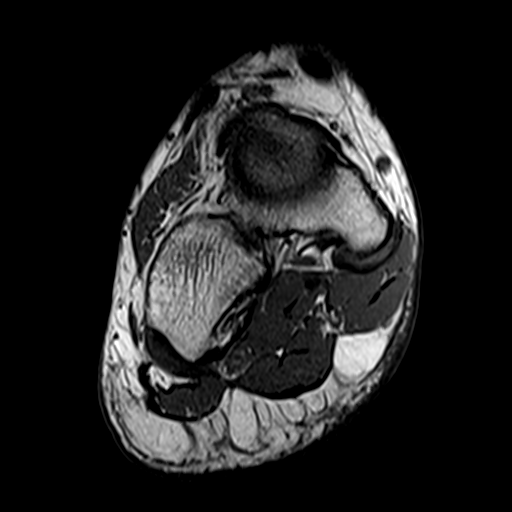

[Series 5: T2 fat-sat · coronal · 3.0mm · 0.38mm/px · 11 of 50 slices shown (1 of 2)]
[im 1/50]
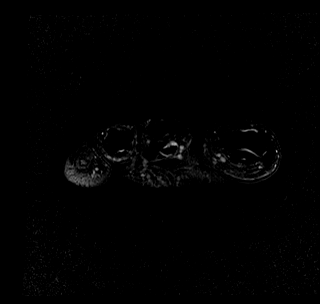
[im 5/50]
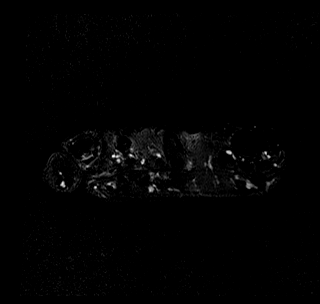
[im 10/50]
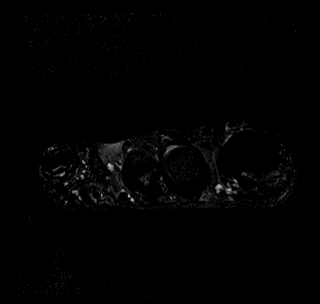
[im 15/50]
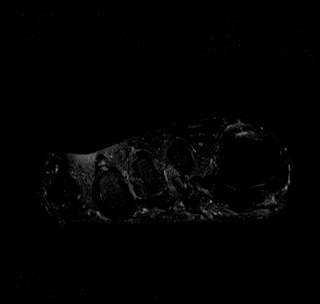
[im 20/50]
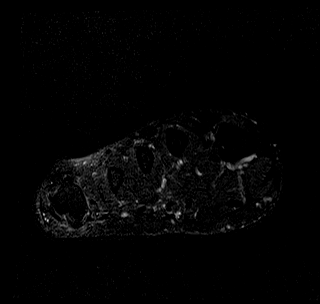
[im 25/50]
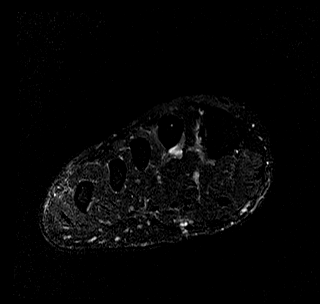
[im 30/50]
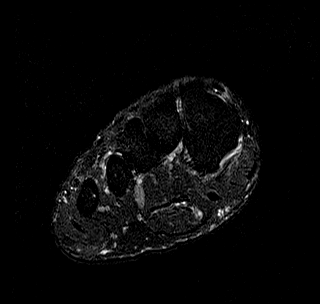
[im 35/50]
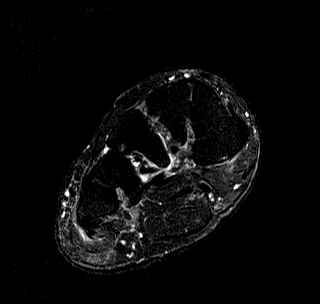
[im 40/50]
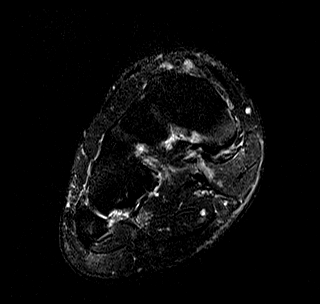
[im 45/50]
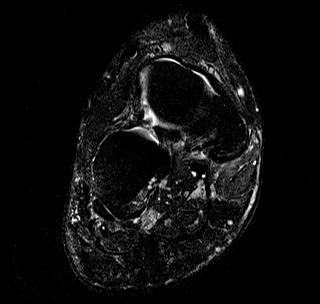
[im 50/50]
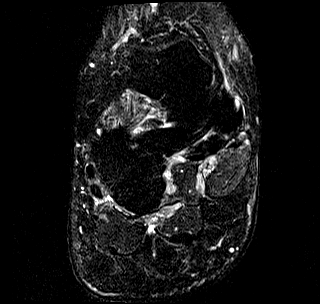

[Series 6: T1 · axial · 3.0mm · 0.35mm/px · z∈[-111,-53]mm · 3 of 28 slices shown (2 of 2)]
[im 6/28]
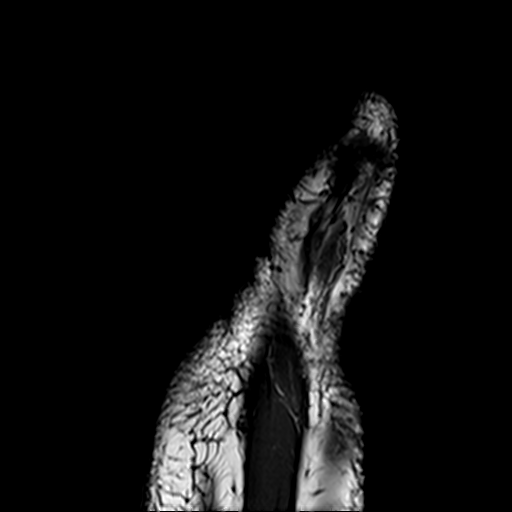
[im 17/28]
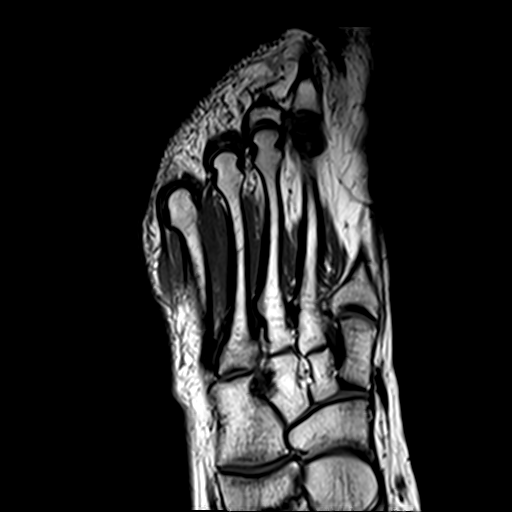
[im 28/28]
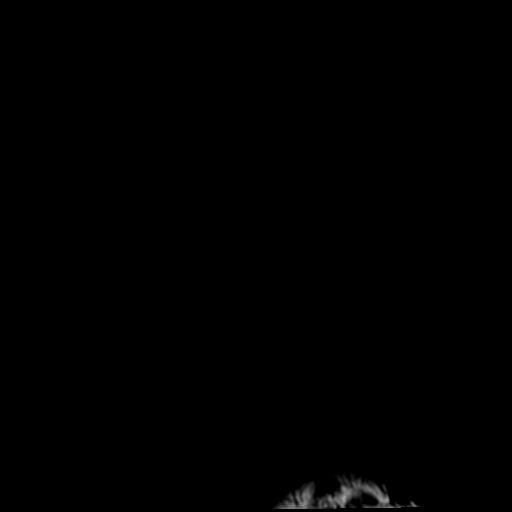

[Series 7: T2 fat-sat · axial · 3.0mm · 0.35mm/px · z∈[-125,-53]mm · 6 of 28 slices shown (2 of 2)]
[im 1/28]
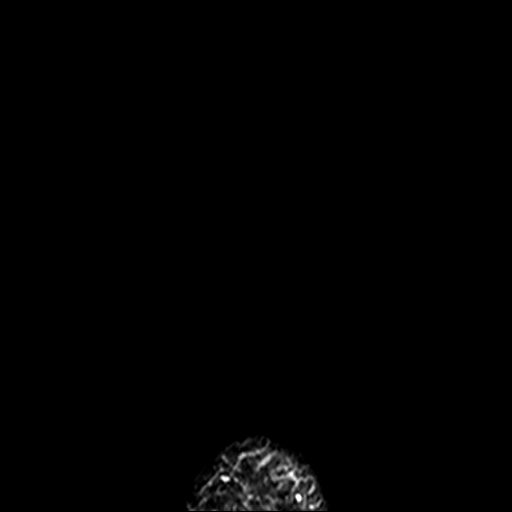
[im 6/28]
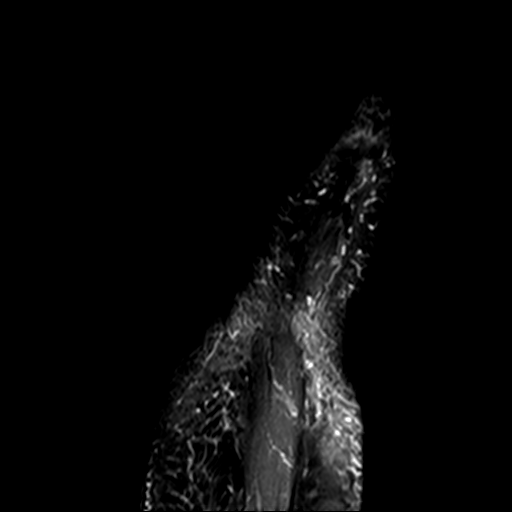
[im 11/28]
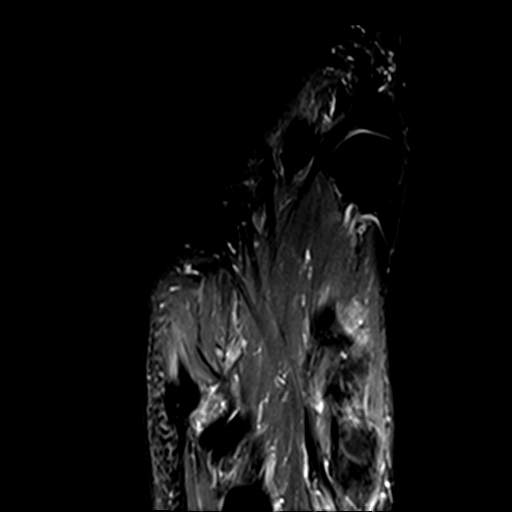
[im 17/28]
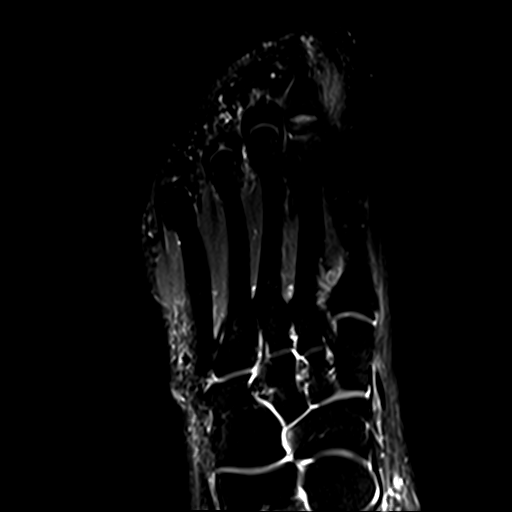
[im 22/28]
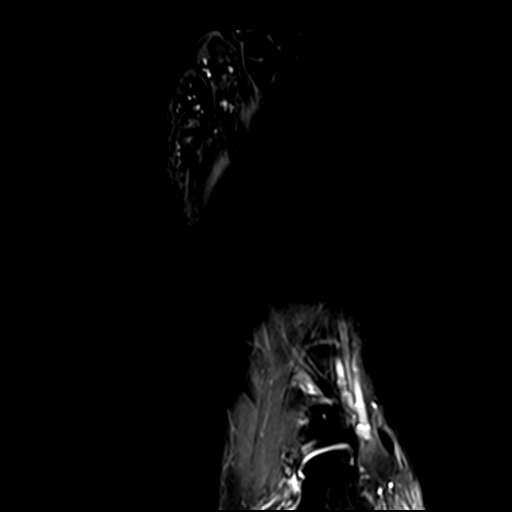
[im 28/28]
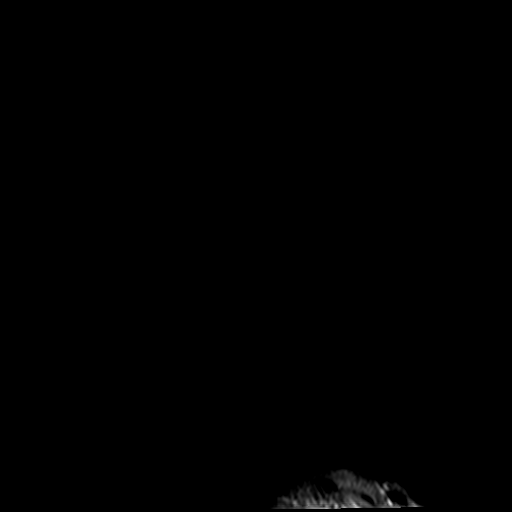

[26 of 40 positions shown; findings below may reference images not displayed]

FINDINGS: Bones/Joint/Cartilage

No fracture or dislocation. Normal alignment. No joint effusion. No
marrow signal abnormality.

Ligaments

Collateral ligaments are intact.  Lisfranc ligament is intact.

Muscles and Tendons

Flexor, peroneal and extensor compartment tendons are intact.
Muscles are normal.

Soft tissue
No fluid collection or hematoma.  No soft tissue mass.
IMPRESSION: 1.  No acute osseous injury of the right forefoot.

## 2021-07-07 ENCOUNTER — Ambulatory Visit: Payer: 59 | Admitting: Sports Medicine

## 2021-07-07 ENCOUNTER — Other Ambulatory Visit: Payer: Self-pay

## 2021-07-07 DIAGNOSIS — Z23 Encounter for immunization: Secondary | ICD-10-CM | POA: Diagnosis not present

## 2021-07-07 DIAGNOSIS — M47816 Spondylosis without myelopathy or radiculopathy, lumbar region: Secondary | ICD-10-CM

## 2021-07-07 NOTE — Progress Notes (Signed)
    Procedures performed today:    None.  Independent interpretation of notes and tests performed by another provider:   None.  Brief History, Exam, Impression, and Recommendations:    Arthropathy of lumbar facet joint As before long history of axial low back pain, RFA that worked well for 6 to 9 months in the past, historically has had bilateral L3-S1 ablations, still awaiting his medial branch blocks and ablations.    ___________________________________________ Ihor Austin. [name], M.D., ABFM., CAQSM. Primary Care and Sports Medicine [facility]  Adjunct Instructor of Family Medicine  [employer]

## 2021-07-07 NOTE — Assessment & Plan Note (Signed)
As before long history of axial low back pain, RFA that worked well for 6 to 9 months in the past, historically has had bilateral L3-S1 ablations, still awaiting his medial branch blocks and ablations.

## 2021-07-17 ENCOUNTER — Other Ambulatory Visit: Payer: Self-pay | Admitting: Medical-Surgical

## 2021-07-17 NOTE — Telephone Encounter (Signed)
Patient stated he would call back at a later time to schedule this appt.

## 2021-07-17 NOTE — Telephone Encounter (Signed)
Looks like he was due for mental health f/u in May, has been seen in office recently for ortho issues.

## 2021-07-17 NOTE — Telephone Encounter (Signed)
Due for follow up on anxiety/mood. Please schedule appointment. 30-day supply sent.

## 2021-08-24 ENCOUNTER — Ambulatory Visit: Payer: 59 | Admitting: Medical-Surgical

## 2021-08-24 ENCOUNTER — Encounter: Payer: Self-pay | Admitting: Medical-Surgical

## 2021-08-24 ENCOUNTER — Other Ambulatory Visit: Payer: Self-pay

## 2021-08-24 ENCOUNTER — Other Ambulatory Visit: Payer: Self-pay | Admitting: Medical-Surgical

## 2021-08-24 VITALS — BP 134/83 | HR 78 | Resp 20 | Ht 71.0 in | Wt 292.0 lb

## 2021-08-24 DIAGNOSIS — M79672 Pain in left foot: Secondary | ICD-10-CM | POA: Diagnosis not present

## 2021-08-24 DIAGNOSIS — M79671 Pain in right foot: Secondary | ICD-10-CM | POA: Diagnosis not present

## 2021-08-24 DIAGNOSIS — F419 Anxiety disorder, unspecified: Secondary | ICD-10-CM | POA: Diagnosis not present

## 2021-08-24 DIAGNOSIS — G4733 Obstructive sleep apnea (adult) (pediatric): Secondary | ICD-10-CM

## 2021-08-24 DIAGNOSIS — R41840 Attention and concentration deficit: Secondary | ICD-10-CM

## 2021-08-24 MED ORDER — CLONAZEPAM 1 MG PO TABS: 1.0000 mg | ORAL_TABLET | Freq: Two times a day (BID) | ORAL | 2 refills | Status: DC | PRN

## 2021-08-24 MED ORDER — AMITRIPTYLINE HCL 25 MG PO TABS: ORAL_TABLET | ORAL | 0 refills | Status: DC

## 2021-08-24 MED ORDER — AMPHETAMINE-DEXTROAMPHET ER 30 MG PO CP24: 30.0000 mg | ORAL_CAPSULE | Freq: Every day | ORAL | 0 refills | Status: DC

## 2021-08-24 MED ORDER — ZOLPIDEM TARTRATE 5 MG PO TABS: 5.0000 mg | ORAL_TABLET | Freq: Every evening | ORAL | 1 refills | Status: DC | PRN

## 2021-08-24 NOTE — Progress Notes (Signed)
HPI with pertinent ROS:   CC: anxiety  HPI: Pleasant 50 year old male presenting today for the following:  Anxiety-Has been taking Klonopin 1-2 mg daily regularly for several years.  Recently, went on a camping trip and did not take his medication for nearly 2 weeks.  On the way back, he began to have significant anxiety and ended up taking a dose.  He feels like he has to have this every day even though he has gone several days between dosing recently.  He always ends up having significant anxiety that prompts him to take another dose.  He has only been taking 1 mg/day on average and notes that his 30-day supply has been lasting more along the lines of 45 days.  Insomnia- Has been taking ZzzQuil for approximately 10 years nightly and is taking 1.5 of the recommended dose.  Also taking up to 80 mg of melatonin each night.  At night he fights going to sleep and it physically painful for him.  He notes that he has been diagnosed with delayed sleep phase syndrome and is using light therapy for that.  Previously tried multiple medications to help with sleep including Ambien, Lunesta, and several others.  Feels like he cannot shut his mind off at night in order to go to sleep.  Would like a medication that is safe long-term that will help him get some sleep at night without the struggle.  Sleep apnea-has a CPAP machine at home but unfortunately, the masks no longer fit with his recent weight gain.  He does have the hoses and other equipment as needed.  He is unsure what the name of his CPAP machine is but plans to send that to me and a MyChart message.  Also will let me know what his DME company preference is.  He would like to be able to get supplies without having to redo a sleep study if at all possible.  Inattention-was previously evaluated and found to be on the borderline between ADHD and OCD.  He did not want to start OCD treatment at that time.  Since then, he has done quite a bit of research and  evaluation.  He feels like he is more on the ADHD spectrum.  He did take Adderall XR 10 mg daily with minimal benefit however he admits to playing around with a dose and finding that taking 2-3 of those was more helpful and allowed him to focus.  He is very interested in getting restarted on something to help with ADHD type symptoms so that he can get things done and hopefully lessen some of his anxiety.  I reviewed the past medical history, family history, social history, surgical history, and allergies today and no changes were needed.  Please see the problem list section below in epic for further details.   Physical exam:   General: Well Developed, well nourished, and in no acute distress.  Neuro: Alert and oriented x3.  HEENT: Normocephalic, atraumatic.  Skin: Warm and dry. Cardiac: Regular rate and rhythm, no murmurs rubs or gallops, no lower extremity edema.  Respiratory: Clear to auscultation bilaterally. Not using accessory muscles, speaking in full sentences.  Impression and Recommendations:    1. Anxiety 2.  Insomnia Patient continues to struggle with significant anxiety and a lot of this revolves around his health.  Continue to discuss the dependence on Klonopin as the workhorse of his mood management.  He was able to go approximately 2 weeks without this however did have significant anxiety  rebound afterwards.  After much discussion, he is willing to trial a tricyclic antidepressant, amitriptyline at bedtime to help with sleep, body aches, as well as mood management.  Starting 25 mg nightly with increase to 50 mg nightly after the first week.  To get him through the first couple of weeks, we will do a low-dose of Ambien 5 mg nightly as needed for sleep.  Do not take Ambien, amitriptyline, and Klonopin together to avoid oversedation.  2. Obstructive sleep apnea syndrome He has machine already so he just needs to let me know what DME company he would like and what the brand of his  machine is and I will be glad to send over a prescription for new masks so that he can start using this again.  3. Bilateral foot pain Ultimately, he notes that the treatment so far has been subpar and he has not received any relief.  He is interested in getting injections into his feet to see if a local corticosteroid injection would do more than the systemic has.  He is open to what ever provider is willing to do this and would like me to check in with Dr. Karie Schwalbe here office.  CCing this note to Dr. Karie Schwalbe to see if this is something he is comfortable with or if I need to get the patient in with Ortho or podiatry for further treatment.  4. Attention or concentration deficit After discussion of his evaluation, he was on the fence for ADHD versus OCD.  During our conversation today, he did have rapid speech and rapidly changing ideas.  Symptoms do appear to coincide with ADHD given the clinical picture.  10 mg of Adderall did not touch his symptoms without an energy drink so we will restart that with Adderall XR 30 mg daily.  I will follow-up with him in a couple of weeks via MyChart to see how he is responding to the medication.  Return in about 3 months (around 11/24/2021) for mood/insomnia follow up. ___________________________________________ Jared Ohm, DNP, APRN, FNP-BC Primary Care and Sports Medicine [facility] [place]

## 2021-08-25 ENCOUNTER — Encounter: Payer: Self-pay | Admitting: Medical-Surgical

## 2021-08-26 MED ORDER — AMBULATORY NON FORMULARY MEDICATION: 1 refills | Status: DC

## 2021-08-27 ENCOUNTER — Other Ambulatory Visit: Payer: Self-pay | Admitting: Medical-Surgical

## 2021-08-27 MED ORDER — OZEMPIC (0.25 OR 0.5 MG/DOSE) 2 MG/1.5ML ~~LOC~~ SOPN: 0.5000 mg | PEN_INJECTOR | SUBCUTANEOUS | 0 refills | Status: DC

## 2021-08-27 MED ORDER — AMBULATORY NON FORMULARY MEDICATION: 1 refills | Status: DC

## 2021-08-27 MED ORDER — ONDANSETRON 8 MG PO TBDP: 8.0000 mg | ORAL_TABLET | Freq: Three times a day (TID) | ORAL | 3 refills | Status: DC | PRN

## 2021-09-09 DIAGNOSIS — G4733 Obstructive sleep apnea (adult) (pediatric): Secondary | ICD-10-CM | POA: Diagnosis not present

## 2021-09-14 ENCOUNTER — Encounter: Payer: Self-pay | Admitting: Medical-Surgical

## 2021-09-20 ENCOUNTER — Other Ambulatory Visit: Payer: Self-pay | Admitting: Medical-Surgical

## 2021-09-21 ENCOUNTER — Other Ambulatory Visit: Payer: Self-pay | Admitting: Medical-Surgical

## 2021-09-22 ENCOUNTER — Other Ambulatory Visit: Payer: Self-pay | Admitting: Medical-Surgical

## 2021-09-22 NOTE — Telephone Encounter (Signed)
Last OV 11/28, not sure if you wanted to up the Ozempic dose?

## 2021-09-22 NOTE — Telephone Encounter (Signed)
MyChart message sent to patient requesting information for update for Joy.  Tiajuana Amass, CMA

## 2021-09-23 MED ORDER — AMPHETAMINE-DEXTROAMPHET ER 30 MG PO CP24: 30.0000 mg | ORAL_CAPSULE | Freq: Every day | ORAL | 0 refills | Status: DC

## 2021-09-24 ENCOUNTER — Other Ambulatory Visit (HOSPITAL_BASED_OUTPATIENT_CLINIC_OR_DEPARTMENT_OTHER): Payer: Self-pay

## 2021-09-24 ENCOUNTER — Other Ambulatory Visit: Payer: Self-pay | Admitting: Medical-Surgical

## 2021-09-24 MED ORDER — AMPHETAMINE-DEXTROAMPHET ER 30 MG PO CP24
30.0000 mg | ORAL_CAPSULE | Freq: Every day | ORAL | 0 refills | Status: DC
  Filled 2021-09-24: qty 30, 30d supply, fill #0

## 2021-09-24 MED ORDER — OZEMPIC (0.25 OR 0.5 MG/DOSE) 2 MG/1.5ML ~~LOC~~ SOPN
0.5000 mg | PEN_INJECTOR | SUBCUTANEOUS | 2 refills | Status: DC
  Filled 2021-09-24: qty 1.5, 28d supply, fill #0
  Filled 2021-11-20: qty 1.5, 28d supply, fill #1

## 2021-09-24 NOTE — Progress Notes (Signed)
Called [facility] and LVM cancelling prescriptions.

## 2021-09-24 NOTE — Progress Notes (Signed)
Please call [facility] and cancel the Adderall prescription that was sent on 09/23/2021. Also cancel any pending prescriptions for him at that location. Changing his pharmacy to [facility]. Already reordered the Ozempic and Adderall to go there.   ___________________________________________ Thayer Ohm, DNP, APRN, FNP-BC Primary Care and Sports Medicine [facility] [place]

## 2021-10-15 ENCOUNTER — Other Ambulatory Visit: Payer: Self-pay | Admitting: Medical-Surgical

## 2021-10-15 NOTE — Telephone Encounter (Signed)
Pt due for a follow up in February.

## 2021-10-15 NOTE — Telephone Encounter (Signed)
Too soon to refill. Last picked up on 09/24/2021.

## 2021-10-16 ENCOUNTER — Other Ambulatory Visit (HOSPITAL_BASED_OUTPATIENT_CLINIC_OR_DEPARTMENT_OTHER): Payer: Self-pay

## 2021-10-29 ENCOUNTER — Other Ambulatory Visit (HOSPITAL_BASED_OUTPATIENT_CLINIC_OR_DEPARTMENT_OTHER): Payer: Self-pay

## 2021-10-29 ENCOUNTER — Encounter: Payer: Self-pay | Admitting: Medical-Surgical

## 2021-10-29 ENCOUNTER — Ambulatory Visit: Payer: 59 | Admitting: Medical-Surgical

## 2021-10-29 ENCOUNTER — Other Ambulatory Visit: Payer: Self-pay

## 2021-10-29 VITALS — BP 115/77 | HR 78 | Resp 20 | Ht 71.0 in | Wt 295.0 lb

## 2021-10-29 DIAGNOSIS — F419 Anxiety disorder, unspecified: Secondary | ICD-10-CM

## 2021-10-29 DIAGNOSIS — G4733 Obstructive sleep apnea (adult) (pediatric): Secondary | ICD-10-CM

## 2021-10-29 DIAGNOSIS — F5104 Psychophysiologic insomnia: Secondary | ICD-10-CM | POA: Diagnosis not present

## 2021-10-29 DIAGNOSIS — K5903 Drug induced constipation: Secondary | ICD-10-CM | POA: Diagnosis not present

## 2021-10-29 DIAGNOSIS — R41840 Attention and concentration deficit: Secondary | ICD-10-CM | POA: Diagnosis not present

## 2021-10-29 MED ORDER — AMITRIPTYLINE HCL 50 MG PO TABS
ORAL_TABLET | ORAL | 0 refills | Status: DC
  Filled 2021-10-29: qty 90, 90d supply, fill #0

## 2021-10-29 MED ORDER — AMPHETAMINE-DEXTROAMPHET ER 15 MG PO CP24
15.0000 mg | ORAL_CAPSULE | ORAL | 0 refills | Status: DC
  Filled 2021-10-29: qty 30, 30d supply, fill #0

## 2021-10-29 NOTE — Progress Notes (Signed)
HPI with pertinent ROS:   CC: Medication side effects  HPI: Pleasant 51 year old male presenting today to discuss the following:  Medication side effects-has been experiencing some significant side effects since starting amitriptyline.  He is taking medication and tolerating well overall however he notes that he has severe constipation that is leading him to spend 4 to 6 hours out of the day sitting on the toilet.  Notes that he has both urinary and fecal urge however he cannot go and has become very frustrated.  He has the urge to urinate but at times it takes upwards of 30 to 45 minutes to be able to empty his bladder.  He has been using over-the-counter suppositories and medications to help with constipation.  He is nervous to reduce the amitriptyline as he is afraid it may not work but is very frustrated with his overall side effects.  Weight concerns-was prescribed to take Ozempic and did this weekly for about 3 weeks before the constipation started.  Once he developed constipation, he stopped taking the Ozempic as he read that this was a likely side effect.  He is extremely concerned about his weight and wants to resume the Ozempic but is hesitant due to constipation issues.  Inattention-taking Adderall XR 30 mg daily but feels this is making him too focused and he gets angry and annoyed when people take him away from his work.  He appears to have become hyper fixated on this and has even started lying to his wife in order to be able to go upstairs and do work.  He stopped the Adderall about 2 weeks ago on the weekend and told his wife that he was not in a good place mentally and that he needed his space.  Notes that while he was taking the medication he had begun to experience palpitations and received alerts from his smart watch that his heart rate had changed.  Was previously taking a 15 mg dose and notes that this was more helpful without the other side effects.  I reviewed the past medical  history, family history, social history, surgical history, and allergies today and no changes were needed.  Please see the problem list section below in epic for further details.  Physical exam:   General: Well Developed, well nourished, and in no acute distress.  Neuro: Alert and oriented x3.  HEENT: Normocephalic, atraumatic.  Skin: Warm and dry. Cardiac: Regular rate and rhythm, no murmurs rubs or gallops, no lower extremity edema.  Respiratory: Clear to auscultation bilaterally. Not using accessory muscles, speaking in full sentences. Psych: Rapid speech, normal behavior, normal mood, rapid subject changes  Impression and Recommendations:    1. Attention or concentration deficit 2. Anxiety Decreasing Adderall to 15mg  XR daily. Changing Amitriptyline to 25-50mg  nightly. Recommend considering referral to psychiatry given the broad range of symptoms and the exacerbating factors. Patient will consider this and let me know.   3. Drug-induced constipation Lowering doses of medications. Increase water and exercise. Recommend adding Colace 100mg  twice daily as needed. Ok to  use Miralax and add fiber supplements if desired.   4. Obstructive sleep apnea syndrome 5. Insomnia Continue wearing CPAP. Continue sparing use of Ambien. Ok to use Zzquil in appropriate doses as needed but recommend not using this in conjunction with Ambien and should not take every night. Discussed various options to help with both mood and sleep including Seroquel, Trazodone, Doxepin, and Cymbalta. He would like to research these and let me know if he  wants to try switching from Amitriptyline to another agent.   Return in about 3 months (around 01/26/2022) for ADHD follow up, mood follow up. ___________________________________________ Clearnce Sorrel, DNP, APRN, FNP-BC Primary Care and [place]

## 2021-10-29 NOTE — Patient Instructions (Signed)
It was good to see you today.  Please see below for details on our current plan:  Decreasing Adderall to 15 mg extended release. Changing amitriptyline to 25 mg - 50 mg nightly. For constipation, please start Colace 100 mg twice daily as needed, a fiber supplement, increase water intake, and increase physical exercise. Consider a referral to psychiatry. Continue using your CPAP every night. Try to limit use of Ambien and or ZzzQuil.  Other alternatives for medications that may help with mood as well as sleep include Cymbalta, trazodone, doxepin, and Seroquel.

## 2021-10-30 ENCOUNTER — Other Ambulatory Visit (HOSPITAL_BASED_OUTPATIENT_CLINIC_OR_DEPARTMENT_OTHER): Payer: Self-pay

## 2021-10-30 MED ORDER — CARESTART COVID-19 HOME TEST VI KIT
PACK | 0 refills | Status: DC
  Filled 2021-10-30: qty 4, 4d supply, fill #0

## 2021-11-20 ENCOUNTER — Other Ambulatory Visit: Payer: Self-pay | Admitting: Medical-Surgical

## 2021-11-20 ENCOUNTER — Encounter: Payer: Self-pay | Admitting: Medical-Surgical

## 2021-11-20 ENCOUNTER — Other Ambulatory Visit (HOSPITAL_BASED_OUTPATIENT_CLINIC_OR_DEPARTMENT_OTHER): Payer: Self-pay

## 2021-11-20 ENCOUNTER — Other Ambulatory Visit: Payer: Self-pay | Admitting: Pharmacist

## 2021-11-20 MED ORDER — AMITRIPTYLINE HCL 50 MG PO TABS
ORAL_TABLET | ORAL | 0 refills | Status: DC
  Filled 2021-11-20 – 2022-01-13 (×2): qty 90, fill #0

## 2021-11-20 MED ORDER — AMPHETAMINE-DEXTROAMPHET ER 15 MG PO CP24
15.0000 mg | ORAL_CAPSULE | ORAL | 0 refills | Status: DC
  Filled 2021-11-30: qty 30, 30d supply, fill #0

## 2021-11-23 ENCOUNTER — Other Ambulatory Visit (HOSPITAL_BASED_OUTPATIENT_CLINIC_OR_DEPARTMENT_OTHER): Payer: Self-pay

## 2021-11-23 MED ORDER — CARESTART COVID-19 HOME TEST VI KIT
PACK | 0 refills | Status: DC
  Filled 2021-11-23: qty 2, 4d supply, fill #0

## 2021-11-23 MED ORDER — CLONAZEPAM 1 MG PO TABS
1.0000 mg | ORAL_TABLET | Freq: Two times a day (BID) | ORAL | 2 refills | Status: DC | PRN
  Filled 2021-11-23: qty 60, 30d supply, fill #0
  Filled 2021-12-18: qty 60, 30d supply, fill #1
  Filled 2022-01-13 – 2022-01-19 (×2): qty 60, 30d supply, fill #2

## 2021-11-24 ENCOUNTER — Other Ambulatory Visit (HOSPITAL_BASED_OUTPATIENT_CLINIC_OR_DEPARTMENT_OTHER): Payer: Self-pay

## 2021-11-25 ENCOUNTER — Other Ambulatory Visit (HOSPITAL_BASED_OUTPATIENT_CLINIC_OR_DEPARTMENT_OTHER): Payer: Self-pay

## 2021-11-26 ENCOUNTER — Other Ambulatory Visit (HOSPITAL_BASED_OUTPATIENT_CLINIC_OR_DEPARTMENT_OTHER): Payer: Self-pay

## 2021-11-27 ENCOUNTER — Other Ambulatory Visit (HOSPITAL_BASED_OUTPATIENT_CLINIC_OR_DEPARTMENT_OTHER): Payer: Self-pay

## 2021-11-30 ENCOUNTER — Other Ambulatory Visit (HOSPITAL_BASED_OUTPATIENT_CLINIC_OR_DEPARTMENT_OTHER): Payer: Self-pay

## 2021-12-18 ENCOUNTER — Other Ambulatory Visit (HOSPITAL_BASED_OUTPATIENT_CLINIC_OR_DEPARTMENT_OTHER): Payer: Self-pay

## 2022-01-13 ENCOUNTER — Other Ambulatory Visit (HOSPITAL_BASED_OUTPATIENT_CLINIC_OR_DEPARTMENT_OTHER): Payer: Self-pay

## 2022-01-13 ENCOUNTER — Other Ambulatory Visit: Payer: Self-pay | Admitting: Medical-Surgical

## 2022-01-14 MED ORDER — AMPHETAMINE-DEXTROAMPHET ER 15 MG PO CP24
15.0000 mg | ORAL_CAPSULE | ORAL | 0 refills | Status: DC
  Filled 2022-01-14: qty 30, 30d supply, fill #0

## 2022-01-15 ENCOUNTER — Other Ambulatory Visit (HOSPITAL_BASED_OUTPATIENT_CLINIC_OR_DEPARTMENT_OTHER): Payer: Self-pay

## 2022-01-19 ENCOUNTER — Other Ambulatory Visit (HOSPITAL_BASED_OUTPATIENT_CLINIC_OR_DEPARTMENT_OTHER): Payer: Self-pay

## 2022-01-29 ENCOUNTER — Other Ambulatory Visit (HOSPITAL_BASED_OUTPATIENT_CLINIC_OR_DEPARTMENT_OTHER): Payer: Self-pay

## 2022-01-29 ENCOUNTER — Encounter: Payer: Self-pay | Admitting: Medical-Surgical

## 2022-01-29 ENCOUNTER — Ambulatory Visit: Payer: 59 | Admitting: Medical-Surgical

## 2022-01-29 VITALS — BP 132/90 | HR 86 | Resp 20 | Ht 71.0 in | Wt 299.3 lb

## 2022-01-29 DIAGNOSIS — R41840 Attention and concentration deficit: Secondary | ICD-10-CM | POA: Diagnosis not present

## 2022-01-29 DIAGNOSIS — Z7689 Persons encountering health services in other specified circumstances: Secondary | ICD-10-CM

## 2022-01-29 DIAGNOSIS — F5104 Psychophysiologic insomnia: Secondary | ICD-10-CM | POA: Diagnosis not present

## 2022-01-29 DIAGNOSIS — F419 Anxiety disorder, unspecified: Secondary | ICD-10-CM | POA: Diagnosis not present

## 2022-01-29 MED ORDER — WEGOVY 0.25 MG/0.5ML ~~LOC~~ SOAJ
0.2500 mg | SUBCUTANEOUS | 0 refills | Status: DC
  Filled 2022-01-29: qty 2, 28d supply, fill #0

## 2022-01-29 MED ORDER — ONDANSETRON 8 MG PO TBDP
8.0000 mg | ORAL_TABLET | Freq: Three times a day (TID) | ORAL | 3 refills | Status: DC | PRN
  Filled 2022-01-29: qty 20, 7d supply, fill #0
  Filled 2022-03-03: qty 20, 7d supply, fill #1
  Filled 2022-03-22: qty 20, 7d supply, fill #2
  Filled 2022-05-27: qty 20, 7d supply, fill #3

## 2022-01-29 MED ORDER — DULOXETINE HCL 30 MG PO CPEP
ORAL_CAPSULE | ORAL | 0 refills | Status: DC
Start: 2022-01-29 — End: 2022-03-22
  Filled 2022-01-29: qty 53, 30d supply, fill #0

## 2022-01-29 MED ORDER — LISDEXAMFETAMINE DIMESYLATE 20 MG PO CAPS
20.0000 mg | ORAL_CAPSULE | Freq: Every day | ORAL | 0 refills | Status: DC
Start: 2022-01-29 — End: 2022-02-17
  Filled 2022-01-29: qty 30, 30d supply, fill #0

## 2022-01-29 MED ORDER — AMBULATORY NON FORMULARY MEDICATION: 1 refills | Status: AC

## 2022-01-29 NOTE — Progress Notes (Signed)
?  HPI with pertinent ROS:  ? ?CC: Attention deficit, mood follow-up ? ?HPI: ?Pleasant 51 year old male presenting today for follow-up on: ? ?Attention deficit: Taking Adderall XR 15 mg daily, tolerating well overall.  He has noticed that he has significant jaw clenching that has happened since he started taking the Adderall.  The medication does help tremendously with his focus and prevents him from "entering executive function task paralysis".  He is still interested in being treated with a stimulant medication but is not sure if there is something we can do for the jaw clenching since it has started to cause some dental problems. ? ?Mood: Has been taking amitriptyline 50 mg nightly but is having several adverse effects including constipation, dry mouth, dry eye, and urinary hesitancy/retention/incomplete emptying.  These are more annoying than detrimental but he is interested in other options that may benefit his chronic insomnia as well as help manage mood concerns.  Denies SI/HI. ? ?Weight: Was previously taking semaglutide but notes that this was no longer covered by her insurance.  He is interested in other options to help with weight loss.  He is significantly concerned because he is almost 300 pounds today and he was at 331 pounds when he had his gastric bypass surgery.  He does admit that he has room for improvement on exercise and dietary behaviors. ? ?I reviewed the past medical history, family history, social history, surgical history, and allergies today and no changes were needed.  Please see the problem list section below in epic for further details. ? ? ?Physical exam:  ? ?General: Well Developed, well nourished, and in no acute distress.  ?Neuro: Alert and oriented x3.  ?HEENT: Normocephalic, atraumatic.  ?Skin: Warm and dry. ?Cardiac: Regular rate and rhythm, no murmurs rubs or gallops, no lower extremity edema.  ?Respiratory: Clear to auscultation bilaterally. Not using accessory muscles, speaking  in full sentences. ? ?Impression and Recommendations:   ? ?1. Anxiety ?Reduce amitriptyline to 25 mg nightly for 7 days then stop.  Start Cymbalta 30 mg nightly for 7 days and a cross taper with amitriptyline.  After that increase to 60 mg nightly. ?- DULoxetine (CYMBALTA) 30 MG capsule; Take 1 capsule (30 mg total) by mouth daily for 7 days, THEN 2 capsules (60 mg total) daily for 23 days.  Dispense: 53 capsule; Refill: 0 ? ?2. Attention or concentration deficit ?Switching from Adderall to Vyvanse 20 mg daily. ?- lisdexamfetamine (VYVANSE) 20 MG capsule; Take 1 capsule (20 mg total) by mouth daily.  Dispense: 30 capsule; Refill: 0 ? ?3. Psychophysiological insomnia ?Switching from amitriptyline to Cymbalta to see if this is a better fit with fewer side effects.  Okay to continue over-the-counter Benadryl if desired ?- DULoxetine (CYMBALTA) 30 MG capsule; Take 1 capsule (30 mg total) by mouth daily for 7 days, THEN 2 capsules (60 mg total) daily for 23 days.  Dispense: 53 capsule; Refill: 0 ? ?4. Encounter for weight management ?With recent insurance changes, we will send in Wegovy 0.25 mg weekly to see if this will be covered by his insurance. ?- WEGOVY 0.25 MG/0.5ML SOAJ; Inject 0.25 mg into the skin once a week. Use this dose for 1 month (4 shots) and then increase to next higher dose.  Dispense: 2 mL; Refill: 0 ? ?Return in about 3 months (around 05/01/2022) for ADHD follow up. ?___________________________________________ ?Thayer Ohm, DNP, APRN, FNP-BC ?Primary Care and Sports Medicine ?[facility] ?

## 2022-02-01 ENCOUNTER — Telehealth: Payer: Self-pay

## 2022-02-01 ENCOUNTER — Other Ambulatory Visit (HOSPITAL_BASED_OUTPATIENT_CLINIC_OR_DEPARTMENT_OTHER): Payer: Self-pay

## 2022-02-01 NOTE — Telephone Encounter (Signed)
Fax Rx to [facility] ? ?Faxed to [phone] ? ?Fax confirmation successful ?

## 2022-02-02 ENCOUNTER — Other Ambulatory Visit (HOSPITAL_BASED_OUTPATIENT_CLINIC_OR_DEPARTMENT_OTHER): Payer: Self-pay

## 2022-02-04 ENCOUNTER — Other Ambulatory Visit (HOSPITAL_BASED_OUTPATIENT_CLINIC_OR_DEPARTMENT_OTHER): Payer: Self-pay

## 2022-02-04 ENCOUNTER — Telehealth: Payer: Self-pay

## 2022-02-04 NOTE — Telephone Encounter (Addendum)
Initiated Prior authorization [facility]:LEXNTZ 0.25MG /0.5ML auto-injectors Via: Covermymeds Case/Key:[id number] Status: approved  as of 02/04/22 Reason:The request has been approved. The authorization is effective for a maximum of 7 fills from 02/18/2022 to 09/16/2022, as long as the member is enrolled in their current health plan. The request was approved as submitted. This request has been approved for 75mL (4 pens) per 28 days.Additional authorizations have been entered for the following:Wegovy 0.5mg /0.17mL allowing 65mL (4 pens) per 28 days for 7 fills; please reference authorization [id number].Wegovy 1mg /0.36mL allowing 54mL (4 pens) per 28 days for 7 fills; please reference authorization [id number].Wegovy 1.7mg /0.7mL allowing 20mL (4pens) per 28 days for 7 fills; please reference authorization [id number].Wegovy 2.4mg /0.21mL allowing 72mL (4 pens) per 28 days for 7 fills; please reference authorization [id number].These authorizations are effective 02/18/2022 through 09/16/2022. Notified Pt via: Mychart

## 2022-02-05 ENCOUNTER — Other Ambulatory Visit (HOSPITAL_BASED_OUTPATIENT_CLINIC_OR_DEPARTMENT_OTHER): Payer: Self-pay

## 2022-02-08 ENCOUNTER — Other Ambulatory Visit (HOSPITAL_BASED_OUTPATIENT_CLINIC_OR_DEPARTMENT_OTHER): Payer: Self-pay

## 2022-02-09 ENCOUNTER — Other Ambulatory Visit (HOSPITAL_BASED_OUTPATIENT_CLINIC_OR_DEPARTMENT_OTHER): Payer: Self-pay

## 2022-02-10 ENCOUNTER — Other Ambulatory Visit (HOSPITAL_BASED_OUTPATIENT_CLINIC_OR_DEPARTMENT_OTHER): Payer: Self-pay

## 2022-02-11 ENCOUNTER — Other Ambulatory Visit (HOSPITAL_BASED_OUTPATIENT_CLINIC_OR_DEPARTMENT_OTHER): Payer: Self-pay

## 2022-02-12 ENCOUNTER — Encounter: Payer: Self-pay | Admitting: Medical-Surgical

## 2022-02-12 ENCOUNTER — Other Ambulatory Visit (HOSPITAL_BASED_OUTPATIENT_CLINIC_OR_DEPARTMENT_OTHER): Payer: Self-pay

## 2022-02-14 DIAGNOSIS — M791 Myalgia, unspecified site: Secondary | ICD-10-CM | POA: Diagnosis not present

## 2022-02-14 DIAGNOSIS — J020 Streptococcal pharyngitis: Secondary | ICD-10-CM | POA: Diagnosis not present

## 2022-02-14 DIAGNOSIS — R52 Pain, unspecified: Secondary | ICD-10-CM | POA: Diagnosis not present

## 2022-02-15 ENCOUNTER — Other Ambulatory Visit (HOSPITAL_BASED_OUTPATIENT_CLINIC_OR_DEPARTMENT_OTHER): Payer: Self-pay

## 2022-02-15 NOTE — Progress Notes (Unsigned)
Complete physical exam  Patient: Jared Tran   DOB: [birth year]   51 y.o. Male  MRN: [id number]  Subjective:    No chief complaint on file.   Jared Tran is a 51 y.o. male who presents today for a complete physical exam. He reports consuming a {diet types:17450} diet. {types:19826} He generally feels {DESC; WELL/FAIRLY WELL/POORLY:18703}. He reports sleeping {DESC; WELL/FAIRLY WELL/POORLY:18703}. He {does/does not:200015} have additional problems to discuss today.    Most recent fall risk assessment:    01/29/2022    9:18 AM  Fall Risk   Falls in the past year? 0  Number falls in past yr: 0  Injury with Fall? 0  Risk for fall due to : No Fall Risks  Follow up Falls evaluation completed     Most recent depression screenings:    01/29/2022    9:18 AM 08/24/2021    2:21 PM  PHQ 2/9 Scores  PHQ - 2 Score 0 2  PHQ- 9 Score  14    {VISON DENTAL STD PSA (Optional):27386}  {History (Optional):23778}  Patient Care Team: Christen Butter, NP as PCP - General (Nurse Practitioner)   Outpatient Medications Prior to Visit  Medication Sig   AMBULATORY NON FORMULARY MEDICATION Continuous positive airway pressure (CPAP): Patient already has the machine (see below) Please provide all supplemental supplies as needed.  Mask: ResMed AirTouch P20 (Size:Large)    CPAP is a : Engineer, manufacturing systems [employer]  [phone]  fax [phone] (We offer on-call services after hours and on weekends) [address] [place], [place] [address]   amitriptyline (ELAVIL) 50 MG tablet Take 1/2 - 1 tablet by mouth nightly   amphetamine-dextroamphetamine (ADDERALL XR) 15 MG 24 hr capsule Take 1 capsule by mouth every morning.   calcium-vitamin D (OSCAL WITH D) 500-200 MG-UNIT TABS tablet Take 1 tablet by mouth daily.   clonazePAM (KLONOPIN) 1 MG tablet Take 1 tablet (1 mg total) by mouth 2 (two) times daily as needed for anxiety.   Cyanocobalamin (VITAMIN  B-12) 5000 MCG TBDP Take 1 tablet by mouth daily.   DULoxetine (CYMBALTA) 30 MG capsule Take 1 capsule (30 mg total) by mouth daily for 7 days, THEN 2 capsules (60 mg total) daily for 23 days.   Ferrous Sulfate (IRON PO) Take 1 tablet by mouth daily.   lidocaine (LMX) 4 % cream Apply 1 application topically daily as needed.   lisdexamfetamine (VYVANSE) 20 MG capsule Take 1 capsule (20 mg total) by mouth daily.   MULTIPLE VITAMIN PO Take 1 tablet by mouth 2 (two) times daily.   ondansetron (ZOFRAN-ODT) 8 MG disintegrating tablet Take 1 tablet (8 mg total) by mouth every 8 (eight) hours as needed for nausea.   Semaglutide,0.25 or 0.5MG /DOS, (OZEMPIC, 0.25 OR 0.5 MG/DOSE,) 2 MG/1.5ML SOPN Inject 0.5 mg into the skin once a week.   WEGOVY 0.25 MG/0.5ML SOAJ Inject 0.25 mg into the skin once a week. Use this dose for 1 month (4 shots) and then increase to next higher dose.   zolpidem (AMBIEN) 5 MG tablet TAKE 1 TABLET BY MOUTH AT BEDTIME AS NEEDED FOR SLEEP.   No facility-administered medications prior to visit.    ROS        Objective:     There were no vitals taken for this visit. {Vitals History (Optional):23777}  Physical Exam   No results found for any visits on 02/16/22. {Show previous labs (optional):23779}  Assessment & Plan:    Routine Health Maintenance and Physical Exam  Immunization History  Administered Date(s) Administered   Influenza Split 07/01/2011, 06/09/2017   Influenza, High Dose Seasonal PF 07/12/2014   Influenza, Quadrivalent, Recombinant, Inj, Pf 07/08/2018   Influenza,inj,Quad PF,6+ Mos 07/13/2014, 07/07/2021   Influenza,inj,Quad PF,6-35 Mos 06/30/2015, 08/21/2019   Influenza,inj,quad, With Preservative 07/11/2013, 07/13/2014, 06/23/2016   Influenza,trivalent, recombinat, inj, PF 07/08/2018   PFIZER(Purple Top)SARS-COV-2 Vaccination 12/18/2019, 01/15/2020, 09/12/2020, 05/05/2021   Tdap 07/01/2011, 03/16/2020   Zoster Recombinat (Shingrix) 10/29/2019,  02/14/2020    Health Maintenance  Topic Date Due   COVID-19 Vaccine (5 - Booster for Pfizer series) 06/30/2021   COLONOSCOPY (Pts 45-38yrs Insurance coverage will need to be confirmed)  08/24/2022 (Originally 08/01/2019)   INFLUENZA VACCINE  04/27/2022   TETANUS/TDAP  03/16/2030   Hepatitis C Screening  Completed   HIV Screening  Completed   Zoster Vaccines- Shingrix  Completed   HPV VACCINES  Aged Out    Discussed health benefits of physical activity, and encouraged him to engage in regular exercise appropriate for his age and condition.  Problem List Items Addressed This Visit       Endocrine   Male hypogonadism   Other Visit Diagnoses     Annual physical exam    -  Primary   Thyroid disorder screen       Diabetes mellitus screening          No follow-ups on file.     Christen Butter, NP

## 2022-02-16 ENCOUNTER — Ambulatory Visit (INDEPENDENT_AMBULATORY_CARE_PROVIDER_SITE_OTHER): Payer: 59 | Admitting: Medical-Surgical

## 2022-02-16 ENCOUNTER — Other Ambulatory Visit (HOSPITAL_BASED_OUTPATIENT_CLINIC_OR_DEPARTMENT_OTHER): Payer: Self-pay

## 2022-02-16 ENCOUNTER — Encounter: Payer: Self-pay | Admitting: Medical-Surgical

## 2022-02-16 VITALS — BP 114/80 | HR 85 | Resp 20 | Ht 71.0 in | Wt 296.7 lb

## 2022-02-16 DIAGNOSIS — Z0000 Encounter for general adult medical examination without abnormal findings: Secondary | ICD-10-CM | POA: Diagnosis not present

## 2022-02-16 DIAGNOSIS — Z1329 Encounter for screening for other suspected endocrine disorder: Secondary | ICD-10-CM

## 2022-02-16 DIAGNOSIS — Z125 Encounter for screening for malignant neoplasm of prostate: Secondary | ICD-10-CM

## 2022-02-16 DIAGNOSIS — E291 Testicular hypofunction: Secondary | ICD-10-CM | POA: Diagnosis not present

## 2022-02-16 DIAGNOSIS — Z131 Encounter for screening for diabetes mellitus: Secondary | ICD-10-CM

## 2022-02-16 DIAGNOSIS — R41840 Attention and concentration deficit: Secondary | ICD-10-CM

## 2022-02-16 LAB — POCT GLYCOSYLATED HEMOGLOBIN (HGB A1C): Hemoglobin A1C: 5 % (ref 4.0–5.6)

## 2022-02-16 NOTE — Assessment & Plan Note (Signed)
Checking TSH.

## 2022-02-16 NOTE — Assessment & Plan Note (Signed)
Checking labs as below.  Recommend updating eye exam.  Continue regular dental care.  Wellness information provided with AVS.

## 2022-02-16 NOTE — Assessment & Plan Note (Signed)
Checking testosterone today. 

## 2022-02-16 NOTE — Assessment & Plan Note (Signed)
Checking PSA 

## 2022-02-16 NOTE — Assessment & Plan Note (Signed)
Checking hemoglobin A1c. 

## 2022-02-17 ENCOUNTER — Other Ambulatory Visit (HOSPITAL_BASED_OUTPATIENT_CLINIC_OR_DEPARTMENT_OTHER): Payer: Self-pay

## 2022-02-17 MED ORDER — LISDEXAMFETAMINE DIMESYLATE 40 MG PO CAPS
40.0000 mg | ORAL_CAPSULE | Freq: Every day | ORAL | 0 refills | Status: DC
  Filled 2022-02-17 – 2022-02-24 (×2): qty 30, 30d supply, fill #0

## 2022-02-17 MED ORDER — LISDEXAMFETAMINE DIMESYLATE 40 MG PO CAPS
40.0000 mg | ORAL_CAPSULE | Freq: Every day | ORAL | 0 refills | Status: DC
  Filled 2022-04-26: qty 30, 30d supply, fill #0

## 2022-02-17 MED ORDER — LISDEXAMFETAMINE DIMESYLATE 40 MG PO CAPS
40.0000 mg | ORAL_CAPSULE | Freq: Every day | ORAL | 0 refills | Status: DC
  Filled 2022-03-22 – 2022-03-25 (×2): qty 30, 30d supply, fill #0

## 2022-02-17 NOTE — Addendum Note (Signed)
Addended byChristen Butter on: 02/17/2022 08:15 AM   Modules accepted: Orders

## 2022-02-18 ENCOUNTER — Other Ambulatory Visit (HOSPITAL_BASED_OUTPATIENT_CLINIC_OR_DEPARTMENT_OTHER): Payer: Self-pay

## 2022-02-18 ENCOUNTER — Other Ambulatory Visit: Payer: Self-pay | Admitting: Medical-Surgical

## 2022-02-18 MED ORDER — CLONAZEPAM 1 MG PO TABS
1.0000 mg | ORAL_TABLET | Freq: Two times a day (BID) | ORAL | 2 refills | Status: DC | PRN
  Filled 2022-02-18: qty 60, 30d supply, fill #0
  Filled 2022-03-22: qty 60, 30d supply, fill #1
  Filled 2022-04-26: qty 60, 30d supply, fill #2

## 2022-02-18 NOTE — Telephone Encounter (Signed)
Last written 11/23/2021 #60 with 2 refills Last appt 02/16/2022

## 2022-02-18 NOTE — Telephone Encounter (Signed)
Per Ander Slade will discuss this with patient at his next visit.

## 2022-02-18 NOTE — Telephone Encounter (Signed)
Refills sent. We will need to start discussion regarding reducing his use of Clonazepam in the coming months. This medication should be taken as needed rather than scheduled. We may need to start a slow wean such as (55 tabs/month for 2-3 months then reduce to 50 and so on).

## 2022-02-24 ENCOUNTER — Other Ambulatory Visit (HOSPITAL_BASED_OUTPATIENT_CLINIC_OR_DEPARTMENT_OTHER): Payer: Self-pay

## 2022-02-25 ENCOUNTER — Other Ambulatory Visit (HOSPITAL_BASED_OUTPATIENT_CLINIC_OR_DEPARTMENT_OTHER): Payer: Self-pay

## 2022-02-26 ENCOUNTER — Other Ambulatory Visit (HOSPITAL_BASED_OUTPATIENT_CLINIC_OR_DEPARTMENT_OTHER): Payer: Self-pay

## 2022-02-26 DIAGNOSIS — Z1329 Encounter for screening for other suspected endocrine disorder: Secondary | ICD-10-CM | POA: Diagnosis not present

## 2022-02-26 DIAGNOSIS — Z0000 Encounter for general adult medical examination without abnormal findings: Secondary | ICD-10-CM | POA: Diagnosis not present

## 2022-02-26 DIAGNOSIS — R790 Abnormal level of blood mineral: Secondary | ICD-10-CM | POA: Diagnosis not present

## 2022-02-26 DIAGNOSIS — E291 Testicular hypofunction: Secondary | ICD-10-CM | POA: Diagnosis not present

## 2022-03-01 ENCOUNTER — Encounter: Payer: Self-pay | Admitting: Medical-Surgical

## 2022-03-01 NOTE — Telephone Encounter (Signed)
Task completed. Per provider's request Fe/Tibc/Ferr Panel added to previous lab order with dx code R79.0 (Req #: [id number]).

## 2022-03-02 ENCOUNTER — Other Ambulatory Visit (HOSPITAL_BASED_OUTPATIENT_CLINIC_OR_DEPARTMENT_OTHER): Payer: Self-pay

## 2022-03-02 LAB — CBC WITH DIFFERENTIAL/PLATELET
Absolute Monocytes: 484 cells/uL (ref 200–950)
Basophils Absolute: 41 cells/uL (ref 0–200)
Basophils Relative: 0.7 %
Eosinophils Absolute: 159 cells/uL (ref 15–500)
Eosinophils Relative: 2.7 %
HCT: 45.1 % (ref 38.5–50.0)
Hemoglobin: 15.2 g/dL (ref 13.2–17.1)
Lymphs Abs: 1286 cells/uL (ref 850–3900)
MCH: 31.5 pg (ref 27.0–33.0)
MCHC: 33.7 g/dL (ref 32.0–36.0)
MCV: 93.6 fL (ref 80.0–100.0)
MPV: 11.5 fL (ref 7.5–12.5)
Monocytes Relative: 8.2 %
Neutro Abs: 3929 cells/uL (ref 1500–7800)
Neutrophils Relative %: 66.6 %
Platelets: 177 10*3/uL (ref 140–400)
RBC: 4.82 10*6/uL (ref 4.20–5.80)
RDW: 12.1 % (ref 11.0–15.0)
Total Lymphocyte: 21.8 %
WBC: 5.9 10*3/uL (ref 3.8–10.8)

## 2022-03-02 LAB — LIPID PANEL
Cholesterol: 247 mg/dL — ABNORMAL HIGH (ref <200)
HDL: 54 mg/dL (ref >=40)
LDL Cholesterol (Calc): 163 mg/dL (calc) — ABNORMAL HIGH
Non-HDL Cholesterol (Calc): 193 mg/dL (calc) — ABNORMAL HIGH (ref <130)
Total CHOL/HDL Ratio: 4.6 (calc) (ref <5.0)
Triglycerides: 158 mg/dL — ABNORMAL HIGH (ref <150)

## 2022-03-02 LAB — TESTOSTERONE TOTAL,FREE,BIO, MALES
Albumin: 4.1 g/dL (ref 3.6–5.1)
Sex Hormone Binding: 34 nmol/L (ref 10–50)
Testosterone, Bioavailable: 85.7 ng/dL — ABNORMAL LOW (ref 110.0–575.0)
Testosterone, Free: 45.5 pg/mL — ABNORMAL LOW (ref 46.0–224.0)
Testosterone: 353 ng/dL (ref 250–827)

## 2022-03-02 LAB — IRON,TIBC AND FERRITIN PANEL
%SAT: 46 % (calc) (ref 20–48)
Ferritin: 52 ng/mL (ref 38–380)
Iron: 160 ug/dL (ref 50–180)
TIBC: 351 mcg/dL (calc) (ref 250–425)

## 2022-03-02 LAB — COMPLETE METABOLIC PANEL WITH GFR
AG Ratio: 1.4 (calc) (ref 1.0–2.5)
ALT: 19 U/L (ref 9–46)
AST: 18 U/L (ref 10–35)
Albumin: 4.1 g/dL (ref 3.6–5.1)
Alkaline phosphatase (APISO): 55 U/L (ref 35–144)
BUN: 13 mg/dL (ref 7–25)
CO2: 29 mmol/L (ref 20–32)
Calcium: 9.7 mg/dL (ref 8.6–10.3)
Chloride: 104 mmol/L (ref 98–110)
Creat: 1.11 mg/dL (ref 0.70–1.30)
Globulin: 2.9 g/dL (calc) (ref 1.9–3.7)
Glucose, Bld: 87 mg/dL (ref 65–99)
Potassium: 4.2 mmol/L (ref 3.5–5.3)
Sodium: 141 mmol/L (ref 135–146)
Total Bilirubin: 0.9 mg/dL (ref 0.2–1.2)
Total Protein: 7 g/dL (ref 6.1–8.1)
eGFR: 80 mL/min/{1.73_m2} (ref >=60)

## 2022-03-02 LAB — PSA: PSA: 0.7 ng/mL (ref <=4.00)

## 2022-03-02 LAB — TSH: TSH: 2 mIU/L (ref 0.40–4.50)

## 2022-03-04 ENCOUNTER — Other Ambulatory Visit (HOSPITAL_BASED_OUTPATIENT_CLINIC_OR_DEPARTMENT_OTHER): Payer: Self-pay

## 2022-03-22 ENCOUNTER — Other Ambulatory Visit (HOSPITAL_BASED_OUTPATIENT_CLINIC_OR_DEPARTMENT_OTHER): Payer: Self-pay

## 2022-03-22 ENCOUNTER — Other Ambulatory Visit: Payer: Self-pay | Admitting: Medical-Surgical

## 2022-03-22 DIAGNOSIS — Z7689 Persons encountering health services in other specified circumstances: Secondary | ICD-10-CM

## 2022-03-22 DIAGNOSIS — F5104 Psychophysiologic insomnia: Secondary | ICD-10-CM

## 2022-03-22 DIAGNOSIS — F419 Anxiety disorder, unspecified: Secondary | ICD-10-CM

## 2022-03-22 MED ORDER — DULOXETINE HCL 30 MG PO CPEP
30.0000 mg | ORAL_CAPSULE | Freq: Every day | ORAL | 0 refills | Status: DC
  Filled 2022-03-22: qty 90, 90d supply, fill #0

## 2022-03-23 ENCOUNTER — Other Ambulatory Visit (HOSPITAL_BASED_OUTPATIENT_CLINIC_OR_DEPARTMENT_OTHER): Payer: Self-pay

## 2022-03-23 DIAGNOSIS — G4733 Obstructive sleep apnea (adult) (pediatric): Secondary | ICD-10-CM | POA: Diagnosis not present

## 2022-03-24 ENCOUNTER — Other Ambulatory Visit (HOSPITAL_BASED_OUTPATIENT_CLINIC_OR_DEPARTMENT_OTHER): Payer: Self-pay

## 2022-03-24 ENCOUNTER — Other Ambulatory Visit: Payer: Self-pay | Admitting: Medical-Surgical

## 2022-03-24 MED ORDER — WEGOVY 0.5 MG/0.5ML ~~LOC~~ SOAJ
0.5000 mg | SUBCUTANEOUS | 0 refills | Status: DC
  Filled 2022-03-24: qty 2, 28d supply, fill #0

## 2022-03-25 ENCOUNTER — Other Ambulatory Visit (HOSPITAL_BASED_OUTPATIENT_CLINIC_OR_DEPARTMENT_OTHER): Payer: Self-pay

## 2022-04-26 ENCOUNTER — Other Ambulatory Visit: Payer: Self-pay | Admitting: Medical-Surgical

## 2022-04-26 ENCOUNTER — Other Ambulatory Visit (HOSPITAL_BASED_OUTPATIENT_CLINIC_OR_DEPARTMENT_OTHER): Payer: Self-pay

## 2022-04-26 MED ORDER — WEGOVY 0.5 MG/0.5ML ~~LOC~~ SOAJ
0.5000 mg | SUBCUTANEOUS | 0 refills | Status: DC
Start: 2022-04-26 — End: 2022-08-02
  Filled 2022-04-26 – 2022-05-11 (×2): qty 2, 28d supply, fill #0

## 2022-04-26 NOTE — Telephone Encounter (Signed)
Per provider's last rx note: Use this dose for 1 month (4 shots) and then increase to next higher dose. Please advise if refill is appropriate. Thanks in advance.

## 2022-04-28 ENCOUNTER — Other Ambulatory Visit (HOSPITAL_BASED_OUTPATIENT_CLINIC_OR_DEPARTMENT_OTHER): Payer: Self-pay

## 2022-04-30 ENCOUNTER — Ambulatory Visit: Payer: 59 | Admitting: Medical-Surgical

## 2022-05-11 ENCOUNTER — Other Ambulatory Visit (HOSPITAL_BASED_OUTPATIENT_CLINIC_OR_DEPARTMENT_OTHER): Payer: Self-pay

## 2022-05-18 ENCOUNTER — Other Ambulatory Visit (HOSPITAL_BASED_OUTPATIENT_CLINIC_OR_DEPARTMENT_OTHER): Payer: Self-pay

## 2022-05-24 ENCOUNTER — Ambulatory Visit: Payer: 59 | Admitting: Medical-Surgical

## 2022-05-27 ENCOUNTER — Other Ambulatory Visit: Payer: Self-pay | Admitting: Medical-Surgical

## 2022-05-27 ENCOUNTER — Other Ambulatory Visit (HOSPITAL_BASED_OUTPATIENT_CLINIC_OR_DEPARTMENT_OTHER): Payer: Self-pay

## 2022-05-27 DIAGNOSIS — R41840 Attention and concentration deficit: Secondary | ICD-10-CM

## 2022-05-28 ENCOUNTER — Other Ambulatory Visit: Payer: Self-pay | Admitting: Medical-Surgical

## 2022-05-28 ENCOUNTER — Other Ambulatory Visit (HOSPITAL_BASED_OUTPATIENT_CLINIC_OR_DEPARTMENT_OTHER): Payer: Self-pay

## 2022-05-28 DIAGNOSIS — R41840 Attention and concentration deficit: Secondary | ICD-10-CM

## 2022-05-28 MED ORDER — CLONAZEPAM 1 MG PO TABS
1.0000 mg | ORAL_TABLET | Freq: Two times a day (BID) | ORAL | 0 refills | Status: DC | PRN
  Filled 2022-05-28: qty 60, 30d supply, fill #0

## 2022-05-28 MED ORDER — LISDEXAMFETAMINE DIMESYLATE 40 MG PO CAPS
40.0000 mg | ORAL_CAPSULE | Freq: Every day | ORAL | 0 refills | Status: DC
  Filled 2022-05-28: qty 30, 30d supply, fill #0

## 2022-05-28 NOTE — Telephone Encounter (Signed)
Last office visit 02/16/2022  04/30/2022 canceled by patient   05/24/2022 canceled by provider  Future appointment 06/23/2022  Vyvanse last filled 04/18/2022  Clonazepam last filled 02/18/2022

## 2022-05-28 NOTE — Telephone Encounter (Signed)
30 day supply sent to get him to his upcoming follow up appointment.

## 2022-06-01 ENCOUNTER — Other Ambulatory Visit (HOSPITAL_BASED_OUTPATIENT_CLINIC_OR_DEPARTMENT_OTHER): Payer: Self-pay

## 2022-06-02 ENCOUNTER — Other Ambulatory Visit (HOSPITAL_BASED_OUTPATIENT_CLINIC_OR_DEPARTMENT_OTHER): Payer: Self-pay

## 2022-06-17 ENCOUNTER — Other Ambulatory Visit (HOSPITAL_BASED_OUTPATIENT_CLINIC_OR_DEPARTMENT_OTHER): Payer: Self-pay

## 2022-06-23 ENCOUNTER — Other Ambulatory Visit (HOSPITAL_BASED_OUTPATIENT_CLINIC_OR_DEPARTMENT_OTHER): Payer: Self-pay

## 2022-06-23 ENCOUNTER — Encounter: Payer: Self-pay | Admitting: Medical-Surgical

## 2022-06-23 ENCOUNTER — Ambulatory Visit: Payer: 59 | Admitting: Medical-Surgical

## 2022-06-23 VITALS — BP 115/77 | HR 77 | Resp 20 | Ht 71.0 in | Wt 287.1 lb

## 2022-06-23 DIAGNOSIS — R41840 Attention and concentration deficit: Secondary | ICD-10-CM

## 2022-06-23 DIAGNOSIS — F419 Anxiety disorder, unspecified: Secondary | ICD-10-CM

## 2022-06-23 DIAGNOSIS — M545 Low back pain, unspecified: Secondary | ICD-10-CM

## 2022-06-23 DIAGNOSIS — M79672 Pain in left foot: Secondary | ICD-10-CM

## 2022-06-23 DIAGNOSIS — G8929 Other chronic pain: Secondary | ICD-10-CM

## 2022-06-23 DIAGNOSIS — F5104 Psychophysiologic insomnia: Secondary | ICD-10-CM

## 2022-06-23 DIAGNOSIS — Z23 Encounter for immunization: Secondary | ICD-10-CM | POA: Diagnosis not present

## 2022-06-23 DIAGNOSIS — M79671 Pain in right foot: Secondary | ICD-10-CM

## 2022-06-23 MED ORDER — CLONAZEPAM 1 MG PO TABS
1.0000 mg | ORAL_TABLET | Freq: Two times a day (BID) | ORAL | 2 refills | Status: DC | PRN
  Filled 2022-06-23 – 2022-07-06 (×2): qty 60, 30d supply, fill #0
  Filled 2022-08-02: qty 60, 30d supply, fill #1
  Filled 2022-08-26: qty 60, 30d supply, fill #2

## 2022-06-23 MED ORDER — LISDEXAMFETAMINE DIMESYLATE 40 MG PO CAPS
40.0000 mg | ORAL_CAPSULE | Freq: Every day | ORAL | 0 refills | Status: DC
  Filled 2022-06-23 – 2022-07-06 (×2): qty 30, 30d supply, fill #0

## 2022-06-23 MED ORDER — LISDEXAMFETAMINE DIMESYLATE 40 MG PO CAPS
40.0000 mg | ORAL_CAPSULE | Freq: Every day | ORAL | 0 refills | Status: DC
  Filled 2022-08-02 – 2022-08-04 (×2): qty 30, 30d supply, fill #0

## 2022-06-23 MED ORDER — LISDEXAMFETAMINE DIMESYLATE 40 MG PO CAPS: 40.0000 mg | ORAL_CAPSULE | Freq: Every day | ORAL | 0 refills | Status: DC

## 2022-06-23 NOTE — Progress Notes (Signed)
Established Patient Office Visit  Subjective   Patient ID: Jared Tran, male   DOB: 1971/03/07 Age: 51 y.o. MRN: [id number]   Chief Complaint  Patient presents with   ADHD   Follow-up    HPI Pleasant 51 year old male presenting today for the following:  Anxiety: Self discontinued Cymbalta.  Continues to take clonazepam 1 mg every morning and sometimes 1 mg in the evenings depending on his level of anxiety.  Notes that he is more anxious and stressed when he is working from home but noted when he was camping and working remotely during that week, his tension was improved.  Remains concerned with a maintenance medication to help manage anxiety symptoms since they are on the list of things that can affect the HPA axis and his testosterone levels.  Denies SI/HI.  Attention deficit: To Vyvanse 40 mg daily, tolerating well without side effects.  Feels the medication is helping well and he is able to finish tasks in a timely manner.  No changes in appetite, weight fluctuations, or sleeping patterns.  Trouble sleeping: Continues to have significant difficulty sleeping.  He has tried multiple medications in the past including amitriptyline, Cymbalta, Ambien, Lunesta, and gabapentin.  Notes that he also tried a very strong benzodiazepine that he describes as yellow and previously used as a truth serum back in the war.  Unable to remember the name of this medication.  The unnamed benzodiazepine worked very well to help him sleep however nothing else seems to be making a difference.  Now he reports taking ZzzQuil quite liberally but he still has sleep issues.  Has difficulty getting his mind to shut down in order to be able to fall asleep and if he wakes up in the middle the night, he is again struggling to get back to sleep.  Due to his concerns regarding the HPA axis and testosterone levels, he wants to avoid medications that are on the list which include trazodone, Seroquel, and mirtazapine.  Morbid  obesity: Was previously prescribed [facility] however this has been on backorder and he has been unable to get this.  He does have a stash of Victoza that he was previously prescribed at home but notes that he has been taking 1 injection weekly which leaves him significantly nauseated and feeling poorly.  Foot pain: Continues to have bilateral foot pain on the lateral sides of the feet.  In the area of pain, he has a knot that is very tender when pushed on.  He has already undergone x-rays as well as MRIs of his bilateral feet which showed no abnormalities to explain his continued symptoms.  He is interested in getting referral to a foot specialist because he feels that there must be something wrong and he wants it fixed.  Back pain: Long history of lumbar spine pain.  He has been worked up for this with his last MRI done in 2019.  It did show significant changes in the spine however at that time, there was no need for surgical intervention.  Now he reports that his symptoms have continued and he is ready to see an orthopedic specialist, preferably a spine specialist.  Notes that he wants what ever is causing his pain to be fixed so that he can regain the quality of life he previously had.  Objective:    Vitals:   06/23/22 0925  BP: 115/77  Pulse: 77  Resp: 20  Height: 5\' 11"  (1.803 m)  Weight: 287 lb 1.6 oz (130.2 kg)  SpO2: 99%  BMI (Calculated): 40.06    Physical Exam Vitals reviewed.  Constitutional:      General: He is not in acute distress.    Appearance: Normal appearance. He is obese. He is not ill-appearing.  HENT:     Head: Normocephalic and atraumatic.  Cardiovascular:     Rate and Rhythm: Normal rate and regular rhythm.     Pulses: Normal pulses.     Heart sounds: Normal heart sounds. No murmur heard.    No friction rub. No gallop.  Pulmonary:     Effort: Pulmonary effort is normal. No respiratory distress.     Breath sounds: Normal breath sounds.  Musculoskeletal:        Feet:  Skin:    General: Skin is warm and dry.  Neurological:     Mental Status: He is alert and oriented to person, place, and time.  Psychiatric:        Mood and Affect: Mood normal.        Behavior: Behavior normal.        Thought Content: Thought content normal.        Judgment: Judgment normal.    No results found for this or any previous visit (from the past 24 hour(s)).     The 10-year ASCVD risk score (Arnett DK, et al., 2019) is: 4.7%   Values used to calculate the score:     Age: 88 years     Sex: Male     Is Non-Hispanic African American: No     Diabetic: No     Tobacco smoker: No     Systolic Blood Pressure: 841 mmHg     Is BP treated: No     HDL Cholesterol: 54 mg/dL     Total Cholesterol: 247 mg/dL   Assessment & Plan:   1. Anxiety Would greatly benefit from a maintenance medication to help manage anxiety as well as suspected OCD found on psychological evaluation.  He is very resistant to the addition of an SSRI due to the HPA axis/testosterone issue.  We have discussed this in depth in the past and I do not feel that a low-dose of an SSRI would have a great impact on his testosterone levels but ultimately, he is not willing to take that chance.  Recommend limiting use of clonazepam due to dependence and tolerance risk.  Only take it as needed rather than scheduled and tried to use other measures to manage anxiety before deciding to take a dose of clonazepam.  2. Attention or concentration deficit Doing well with Vyvanse so continue 40 mg daily.  Refill sent to pharmacy. - lisdexamfetamine (VYVANSE) 40 MG capsule; Take 1 capsule (40 mg total) by mouth daily.  Dispense: 30 capsule; Refill: 0 - lisdexamfetamine (VYVANSE) 40 MG capsule; Take 1 capsule (40 mg total) by mouth daily.  Dispense: 30 capsule; Refill: 0 - lisdexamfetamine (VYVANSE) 40 MG capsule; Take 1 capsule (40 mg total) by mouth daily.  Dispense: 30 capsule; Refill: 0  3. Morbid obesity  ([place]) Unfortunately, the backorder for [facility] is out of my hands.  I did discuss with him the possibility of using a compounded medication but the out-of-pocket cost is financially prohibitive.  Reviewed the recommended dosing for Victoza.  Would recommend he start at 0.6 mg for 1 week daily for 1 week then increase to 1.2 mg for 1 week then 1.8 mg for 1 week, and so on to a maximum of 3 mg daily.  4. Psychophysiological  insomnia He has tried multiple medications in the past and has some options that are off the list due to concerns as above.  It looks like the only thing we have not tried so far specifically for sleep is Belsomra, Dayvigo, and Quviviq. Sending him a MyChart message with these options for his review.   5. Need for influenza vaccination Flu vaccine given in office. - Flu Vaccine QUAD 6+ mos PF IM (Fluarix Quad PF)  6. Chronic bilateral low back pain without sciatica Referral to orthopedics entered with a note to please send to a spinal specialist. - AMB referral to orthopedics  7. Bilateral foot pain Referring to podiatry for further investigation and management. - Ambulatory referral to Podiatry   Return in about 3 months (around 09/22/2022) for ADHD follow up.  ___________________________________________ Clearnce Sorrel, DNP, APRN, FNP-BC [facility] [place]

## 2022-06-25 ENCOUNTER — Other Ambulatory Visit (HOSPITAL_BASED_OUTPATIENT_CLINIC_OR_DEPARTMENT_OTHER): Payer: Self-pay

## 2022-07-01 ENCOUNTER — Ambulatory Visit (INDEPENDENT_AMBULATORY_CARE_PROVIDER_SITE_OTHER): Payer: 59

## 2022-07-01 ENCOUNTER — Ambulatory Visit: Payer: 59 | Admitting: Orthopedic Surgery

## 2022-07-01 VITALS — BP 147/87 | HR 105

## 2022-07-01 DIAGNOSIS — M5416 Radiculopathy, lumbar region: Secondary | ICD-10-CM | POA: Diagnosis not present

## 2022-07-01 MED ORDER — QUVIVIQ 25 MG PO TABS
25.0000 mg | ORAL_TABLET | Freq: Every evening | ORAL | 0 refills | Status: DC | PRN
  Filled 2022-07-01 – 2022-07-06 (×2): qty 30, 30d supply, fill #0

## 2022-07-01 NOTE — Progress Notes (Signed)
Orthopedic Spine Surgery Office Note  Assessment: Patient is a 51 y.o. male with chronic axial low back pain and possibly component of L3 radiculopathy on the left side   Plan: -Explained that initially conservative treatment is tried as a significant number of patients may experience relief with these treatment modalities. Discussed that the conservative treatments include:  -activity modification  -physical therapy  -over the counter pain medications  -medrol dosepak  -lumbar steroid injections -Patient has tried weight loss, physical therapy, home exercises, ablations, over-the-counter medications, activity modification, topical creams, ice/heat -Given the long duration of the pain and occasional pains going into his left leg in an L3 distribution, recommended MRI of the lumbar spine -Patient should return to office in 4 weeks to discuss the results the MRI next [facility] management, repeat x-rays of lumbar spine at next visit: None   Patient expressed understanding of the plan and all questions were answered to their satisfaction.   ___________________________________________________________________________   History:  Patient is a 51 y.o. male who presents today for lumbar spine.  Patient has had 40 years of low back pain.  He describes a long history of weight lifting in excess of 500 pounds.  States he did this activity for years and thinks that this may be contributory towards his back pain.  His pain is worse when he bends over or squats.  It seems to improve when he is walking.  He also gets it when he is getting out of bed or when he lays down.  Pain is mostly centered in his lower back but recently at times it will go down his left leg into the anterior thigh.  He states he is done every kind of treatment over the years.  States he is done years and multiple different techniques of physical therapy and home exercises.  He has lost weight and at one point he said he was down to 180  pounds and that did not help either.  He has tried all the over-the-counter medications.  He is tried multiple topical creams.  He has tried ablations which do seem to help but they are very temporary and the relief and may be last a month and a half.  He has tried injections.  Nothing has provided him lasting relief.   Weakness: Denies Symptoms of imbalance: Denies Paresthesias and numbness: Denies Bowel or bladder incontinence: Denies Saddle anesthesia: Denies  Treatments tried: Physical therapy, topical creams, over-the-counter medications, home exercises, epidural steroid injections, nerve ablation, activity modification, weight loss, ice/heat  Review of systems: Denies fevers and chills, night sweats, unexplained weight loss, history of cancer, pain that wakes them at night  Past medical history: OSA GERD Anxiety Dry eyes ADD   Allergies: penicillins (hives, rash), hydrocodone-acetaminophen  Past surgical history:  ACL reconstruction Back surgery Laparoscopic gastric band Shoulder surgery  Social history: Denies use of nicotine product (smoking, vaping, patches, smokeless) Alcohol use: social Denies recreational drug use    Physical Exam:  General: no acute distress, appears stated age Neurologic: alert, answering questions appropriately, following commands Respiratory: unlabored breathing on room air, symmetric chest rise Psychiatric: appropriate affect, normal cadence to speech   MSK (spine):  -Strength exam      Left  Right EHL    5/5  5/5 TA    5/5  5/5 GSC    5/5  5/5 Knee extension  5/5  5/5 Hip flexion   5/5  5/5  -Sensory exam    Sensation intact to light touch  in L3-S1 nerve distributions of bilateral lower extremities  -Achilles DTR: 2/4 on the left, 2/4 on the right -Patellar tendon DTR: 2/4 on the left, 2/4 on the right  -Straight leg raise: Negative -Contralateral straight leg raise: Negative -Femoral nerve stretch test:  Negative -Clonus: no beats bilaterally  -Left hip exam: No pain through range of motion at the hip, negative Darden Palmer, negative Pearlean Brownie -Right hip exam: No pain through range of motion of the hip, negative Darden Palmer, negative Faber  Imaging: XR of the lumbar spine from  07/01/2022 was independently reviewed and interpreted, showing no acute osseous abnormality, no evidence of instability, degenerative changes with disc height loss and anterior osteophyte formation at L1-L2 3 and L3-4.  There is retrolisthesis at L2-3 and L3-4.   Patient name: Jared Tran Patient MRN: [id number] Date of visit: 07/01/22

## 2022-07-02 ENCOUNTER — Other Ambulatory Visit (HOSPITAL_BASED_OUTPATIENT_CLINIC_OR_DEPARTMENT_OTHER): Payer: Self-pay

## 2022-07-05 ENCOUNTER — Other Ambulatory Visit (HOSPITAL_BASED_OUTPATIENT_CLINIC_OR_DEPARTMENT_OTHER): Payer: Self-pay

## 2022-07-06 ENCOUNTER — Ambulatory Visit (INDEPENDENT_AMBULATORY_CARE_PROVIDER_SITE_OTHER): Payer: 59

## 2022-07-06 ENCOUNTER — Other Ambulatory Visit (HOSPITAL_BASED_OUTPATIENT_CLINIC_OR_DEPARTMENT_OTHER): Payer: Self-pay

## 2022-07-06 DIAGNOSIS — M48061 Spinal stenosis, lumbar region without neurogenic claudication: Secondary | ICD-10-CM | POA: Diagnosis not present

## 2022-07-06 DIAGNOSIS — M5416 Radiculopathy, lumbar region: Secondary | ICD-10-CM | POA: Diagnosis not present

## 2022-07-06 DIAGNOSIS — M5126 Other intervertebral disc displacement, lumbar region: Secondary | ICD-10-CM | POA: Diagnosis not present

## 2022-07-07 ENCOUNTER — Other Ambulatory Visit (HOSPITAL_BASED_OUTPATIENT_CLINIC_OR_DEPARTMENT_OTHER): Payer: Self-pay

## 2022-07-08 ENCOUNTER — Other Ambulatory Visit (HOSPITAL_BASED_OUTPATIENT_CLINIC_OR_DEPARTMENT_OTHER): Payer: Self-pay

## 2022-07-09 ENCOUNTER — Telehealth: Payer: Self-pay

## 2022-07-09 NOTE — Telephone Encounter (Signed)
Initiated Prior authorization [id number] 25MG  tablets Via: Covermymeds Case/Key:[id number] Status: approved  as of 07/09/22 Reason:The authorization is effective from 07/06/2022 to 10/04/2022 Notified Pt via: Mychart , submission from another user

## 2022-07-12 ENCOUNTER — Other Ambulatory Visit (HOSPITAL_BASED_OUTPATIENT_CLINIC_OR_DEPARTMENT_OTHER): Payer: Self-pay

## 2022-07-13 ENCOUNTER — Other Ambulatory Visit (HOSPITAL_BASED_OUTPATIENT_CLINIC_OR_DEPARTMENT_OTHER): Payer: Self-pay

## 2022-07-16 ENCOUNTER — Ambulatory Visit (INDEPENDENT_AMBULATORY_CARE_PROVIDER_SITE_OTHER): Payer: 59 | Admitting: Podiatrist

## 2022-07-16 DIAGNOSIS — M79671 Pain in right foot: Secondary | ICD-10-CM

## 2022-07-21 ENCOUNTER — Ambulatory Visit: Payer: 59 | Admitting: Medical-Surgical

## 2022-07-23 ENCOUNTER — Ambulatory Visit: Payer: 59 | Admitting: Podiatrist

## 2022-07-23 ENCOUNTER — Encounter: Payer: Self-pay | Admitting: Podiatrist

## 2022-07-23 DIAGNOSIS — M7672 Peroneal tendinitis, left leg: Secondary | ICD-10-CM

## 2022-07-23 DIAGNOSIS — M6788 Other specified disorders of synovium and tendon, other site: Secondary | ICD-10-CM

## 2022-07-23 DIAGNOSIS — M7671 Peroneal tendinitis, right leg: Secondary | ICD-10-CM | POA: Diagnosis not present

## 2022-07-23 DIAGNOSIS — M79671 Pain in right foot: Secondary | ICD-10-CM

## 2022-07-23 MED ORDER — TRIAMCINOLONE ACETONIDE 10 MG/ML IJ SUSP
10.0000 mg | Freq: Once | INTRAMUSCULAR | Status: AC
  Administered 2022-07-23: 10 mg

## 2022-07-23 NOTE — Patient Instructions (Signed)
Peroneal Tendinopathy  Peroneal tendinopathy is irritation, fraying, or swelling of the tendons that pass behind your ankle bone on the outside of the ankle (peroneal tendons). This causes pain and weakness in your foot and ankle. One of the tendons connects muscles from your outer calf to a bone on the side of your foot. The other attaches under the arch of your foot. What are the causes? This condition may be caused by: Putting stress on your ankle over and over again (overuse injury). A sudden injury that puts stress on your tendons, such as an ankle sprain. What increases the risk? You are more likely to develop this condition if: You have high arches. You play sports that puts stress on the ankle over and over again, such as: Running. Dancing. Soccer. Basketball. What are the signs or symptoms? Symptoms of this condition can start suddenly or develop over time. They include: Pain in the back of the ankle, on the small toe side of the foot, or in the arch of the foot. Pain that gets worse with activity and better with rest. Swelling. Warmth. Weakness in your foot or ankle. How is this diagnosed? This condition may be diagnosed based on: Your symptoms. Your medical history. A physical exam. During the exam, your health care provider may move your foot and ankle and test the strength of your foot and ankle muscles. Imaging tests, such as: X-rays or a CT scan to check for bone injury. MRI or ultrasound to check for muscle or tendon injury. How is this treated? This condition may be treated by: Keeping your body weight off your ankle for several days or weeks. You may do this by using crutches. Putting ice on your ankle to reduce swelling. Taking NSAIDs, such as ibuprofen. Having medicine injected into your tendon to reduce swelling. Wearing a removable boot or brace for ankle support and to rest the injured ankle. Doing range-of-motion and strengthening exercises (physical  therapy). Returning to full activity gradually. Surgery may be needed if: The condition does not get better with treatment. A tendon or muscle is damaged. This may cause more pain. Follow these instructions at home: If you have a boot or brace: Wear the boot or brace as told by your health care provider. Remove it only as told by your health care provider. Check the skin around the boot or brace every day. Tell your health care provider about any concerns. Loosen the boot or brace if your toes tingle, become numb, or turn cold and blue. Keep the boot or brace clean. If the boot or brace is not waterproof: Do not let it get wet. Cover it with a watertight covering when you take a bath or shower. Managing pain, stiffness, and swelling  If directed, put ice on the injured area. To do this: If you have a removable boot or brace, remove it as told by your health care provider. Put ice in a plastic bag. Place a towel between your skin and the bag. Leave the ice on for 20 minutes, 2-3 times a day. If your skin turns bright red, remove the ice right away to prevent skin damage. The risk of skin damage is higher if you cannot feel pain, heat, or cold. Move your toes often to reduce stiffness and swelling. Raise (elevate) the injured area above the level of your heart while you are sitting or lying down. Activity Do not do activities that make pain or swelling worse. Ask your health care provider when it  is safe to drive if you have a boot or brace on your foot. Do not use the injured limb to support your body weight until your health care provider says that you can. Use crutches as told by your health care provider. Do exercises as told by your health care provider. Return to your normal activities as told by your health care provider. Ask your health care provider what activities are safe for you. General instructions Take over-the-counter and prescription medicines only as told by your health  care provider. Do not use any products that contain nicotine or tobacco. These products include cigarettes, chewing tobacco, and vaping devices, such as e-cigarettes. These can delay healing. If you need help quitting, ask your health care provider. Keep all follow-up visits. This may include physical therapy. How is this prevented? Wear shoes that support your feet. Wear the right shoes for the athletic activities you do. Avoid athletic activities that cause swelling or pain in your foot or ankle. See your health care provider if you have pain or swelling that does not get better after a few days of rest. Stop training if you have pain or swelling. If you start a new athletic activity, start gradually to build up your strength, endurance, and flexibility. Warm up and stretch before being active. Cool down and stretch after being active. Contact a health care provider if: Your symptoms get worse. Your symptoms do not get better in 2-4 weeks. You develop new, unexplained symptoms. Summary Peroneal tendinopathy is irritation of the tendons that pass behind the outside of your ankle. This condition is caused by overuse or sudden injury to the peroneal tendons. Symptoms include pain, swelling, warmth, and weakness in your foot or ankle. This condition may be treated with rest, ice, medicine, and physical therapy. You may need surgery if you do not get better with treatment. This information is not intended to replace advice given to you by your health care provider. Make sure you discuss any questions you have with your health care provider. Document Revised: 01/07/2022 Document Reviewed: 01/07/2022 Elsevier Patient Education  [place].

## 2022-07-23 NOTE — Progress Notes (Signed)
Chief Complaint  Patient presents with   Foot Pain    Bilateral foot on going for years      HPI: Patient is 51 y.o. male who presents today for pain both feet posterior to the fifth metatarsal base insertion of peroneal tendon. Has tried oral steroids, a variety of good shoes such as [place], and orthotics with no relief. He has had xrays and an MRI of his feet which were negative.  He relates it all started when he was fishing and his wife startled him from behind.  He fell forward into a bank of large rocks and felt pain at the outside of his feet.  He relates the pain has continued since this event.  He works in Engineer, technical sales for [employer] and also relates he does a lot of [place] with his family. He notices the pain most when he is up on his feet a lot.    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  Mask: ResMed AirTouch P20 (Size:Large)    CPAP is a : [employer]  [phone]  fax [phone] (We offer on-call services after hours and on weekends) [place], [place] [address] 1 Units 1   calcium-vitamin D (OSCAL WITH D) 500-200 MG-UNIT TABS tablet Take 1 tablet by mouth daily.     clonazePAM (KLONOPIN) 1 MG tablet Take 1 tablet (1 mg total) by mouth 2 (two) times daily as needed for anxiety. 60 tablet 2   Cyanocobalamin (VITAMIN B-12) 5000 MCG TBDP Take 1 tablet by mouth daily.     Daridorexant HCl (QUVIVIQ) 25 MG TABS Take 25 mg by mouth at bedtime as needed. 30 tablet 0   Ferrous Sulfate (IRON PO) Take 1 tablet by mouth daily.     lidocaine (LMX) 4 % cream Apply 1 application topically daily as needed.     lisdexamfetamine (VYVANSE) 40 MG capsule Take 1 capsule (40 mg total) by mouth daily. 30 capsule 0   lisdexamfetamine (VYVANSE) 40 MG capsule Take 1 capsule (40 mg total) by mouth daily. 30 capsule 0   [START ON 08/22/2022] lisdexamfetamine (VYVANSE) 40 MG capsule Take 1 capsule (40 mg total) by mouth daily. 30 capsule 0   MULTIPLE VITAMIN PO Take 1 tablet by mouth 2 (two) times daily.     ondansetron (ZOFRAN-ODT) 8 MG disintegrating tablet Take 1 tablet (8 mg total) by mouth every 8 (eight) hours as needed for nausea. 20 tablet 3   WEGOVY 0.5 MG/0.5ML  SOAJ Inject 0.5 mg into the skin once a week. Use this dose for 1 month (4 shots) and then increase to next higher dose. 2 mL 0   No current facility-administered medications on file prior to visit.    Allergies  Allergen Reactions   Penicillins Hives and Rash   Hydrocodone-Acetaminophen Itching and Nausea Only    Review of Systems No fevers, chills, nausea, muscle aches, no difficulty breathing, no calf pain, no chest pain or shortness of breath.   Physical Exam  GENERAL APPEARANCE: Alert, conversant. Appropriately groomed. No acute distress.   VASCULAR: Pedal pulses palpable 2/4 DP and 2/4 PT bilateral.  Capillary refill time  is immediate to all digits,  Proximal to distal cooling is warm to warm.  Digital perfusion adequate.   NEUROLOGIC: sensation is intact to 5.07 monofilament at 5/5 sites bilateral.  Light touch is intact bilateral, vibratory sensation intact bilateral  MUSCULOSKELETAL: acceptable muscle strength, tone and stability bilateral.  No gross boney pedal deformities noted.  Pain at the insertion of the peroneus brevis tendon on the fifth metatarsal base.  Discreet pain in this area is noted.  Some swelling is also palpated at this site as well. Discomfort with inversion eversion and dorsiflexion in this area noted.   DERMATOLOGIC: skin is warm, supple, and dry.  Color, texture, and turgor of skin within normal limits.  No open wounds are noted.  No preulcerative lesions are seen.  Digital nails are asymptomatic.    Xrays show no acute abnormalities, MRI read negative  Assessment     ICD-10-CM   1. Right peroneal tendinosis  M67.88 DG Foot Complete Right    DG Foot Complete Left    2. Peroneus brevis tendinitis, left  M76.72        Plan  Exam findings and treatment recommendations are discussed.  He is interested in an injection if appropriate at todays visit.  I did agree that an injection of steroid could help his condition and this was carried out without complication bilateral. (See procedure note below) I evaluated his orthotics and feel he could benefit form an extrinsic post being added.   I recommended adjustable lace up ankle braces to wear when mowing or doing any exertional activity. Discussed boot immobilization. He states he already has a boot and if necessary would wear it but in all likelyhood would not wear a boot.   We will see how he does with this injection.  I recommended he follow up with Dr. Allena Katz in our [place] office for further treatment recommendations in 6 weeks.  I did mention PRP therapy may be a consideration and that Dr. Allena Katz would be better at judging the  appropriateness for this treatment.   Procedure: Injection bilateral  Discussed alternatives, risks, complications and verbal consent was obtained.  Location: fifth metatarsal base bilateral- above tendon insertion Skin Prep: Alcohol. Injectate: 0.5cc 0.5% marcaine plain, 1 cc 10 mg kenalog Disposition: Patient tolerated procedure well. Injection site dressed with a band-aid.  Post-injection care was discussed and return precautions discussed.

## 2022-07-28 ENCOUNTER — Ambulatory Visit: Payer: 59 | Admitting: Orthopedic Surgery

## 2022-07-28 DIAGNOSIS — M47816 Spondylosis without myelopathy or radiculopathy, lumbar region: Secondary | ICD-10-CM | POA: Diagnosis not present

## 2022-07-28 NOTE — Progress Notes (Signed)
Orthopedic Spine Surgery Office Note  Assessment: Patient is a 51 y.o. male with chronic axial back pain with facet arthropathy and left-sided radiculopathy.  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 Also, has left SI joint pain and physical exam provocative maneuvers that point towards this as an etiology of pain.   Plan: -I discussed at length his MRI findings which included L1-2 and L2-3 facet hypertrophy with lateral recess stenosis.  I explained that this may be a cause of his left leg symptoms.  Told him that I will surgery could help with that pain but would not likely remove his chronic back pain.  He inquired about a arthritis type cleanout procedure.  I explained that there is not a great procedure for that and spine.  Since most of his symptoms are in the back, we decided that the benefit of relief of his leg pain did not outweigh the risks associated with the spine surgery.  We talked that his other option would be pain management for ablation since he has gotten significant relief in the past.  He was interested in trying that again.  A referral was provided to him for pain management. -Patient should return to office on an as needed basis   Patient expressed understanding of the plan and all questions were answered to the patient's satisfaction.   ___________________________________________________________________________  History: Patient is a 51 y.o. male who has been previously seen in the office for symptoms consistent with lumbar radiculopathy. Since the last visit, symptom intensity has become slightly more severe.  States he was more active recently and was doing some camping.  He thinks that that flared up his pain.  Pain is still felt in the low back with some pain in the left anterior thigh.  He says that 90 to 95% of his pain is in the low back.  He has gotten good relief with nerve ablations in the past.  Previous treatments: Physical therapy, topical creams,  over-the-counter medications, home exercises, epidural steroid injections, nerve ablation, activity modification, weight loss, ice/heat  COPY OF OLD NOTE Patient is a 51 y.o. male who presents today for lumbar spine.  Patient has had 40 years of low back pain.  He describes a long history of weight lifting in excess of 500 pounds.  States he did this activity for years and thinks that this may be contributory towards his back pain.  His pain is worse when he bends over or squats.  It seems to improve when he is walking.  He also gets it when he is getting out of bed or when he lays down.  Pain is mostly centered in his lower back but recently at times it will go down his left leg into the anterior thigh.  He states he is done every kind of treatment over the years.  States he is done years and multiple different techniques of physical therapy and home exercises.  He has lost weight and at one point he said he was down to 180 pounds and that did not help either.  He has tried all the over-the-counter medications.  He is tried multiple topical creams.  He has tried ablations which do seem to help but they are very temporary and the relief and may be last a month and a half.  He has tried injections.  Nothing has provided him lasting relief.     Weakness: Denies Symptoms of imbalance: Denies Paresthesias and numbness: Denies Bowel or bladder incontinence: Denies Saddle  anesthesia: Denies END OF COPY  Physical Exam:  General: no acute distress, appears stated age Neurologic: alert, answering questions appropriately, following commands Respiratory: unlabored breathing on room air, symmetric chest rise Psychiatric: appropriate affect, normal cadence to speech   MSK (spine):  -Strength exam      Left  Right EHL    5/5  5/5 TA    5/5  5/5 GSC    5/5  5/5 Knee extension  5/5  5/5 Hip flexion   5/5  5/5  -Sensory exam    Sensation intact to light touch in L3-S1 nerve distributions of  bilateral lower extremities  -positive FABER, SI joint compression, and single leg stance on the left leg. None of these were positive on the right side  Imaging: XR of the lumbar spine from 07/01/2022 was previously independently reviewed and interpreted, showing disc height loss at L1/2, L2/3, and L3/4. No evidence of instability on flexion/extension. No fracture or dislocation.   MRI of the lumbar spine from 07/06/2022 was independently reviewed and interpreted, showing DDD at L1/2 and L2/2. Lateral recess stenosis at L1/2 and L2/3.  Facet arthropathy at several levels.   Patient name: Jared Tran Patient MRN: [id number] Date of visit: 07/28/22

## 2022-07-29 ENCOUNTER — Ambulatory Visit: Payer: 59 | Admitting: Orthopedic Surgery

## 2022-08-02 ENCOUNTER — Other Ambulatory Visit: Payer: Self-pay | Admitting: Medical-Surgical

## 2022-08-03 ENCOUNTER — Other Ambulatory Visit: Payer: Self-pay

## 2022-08-03 ENCOUNTER — Encounter: Payer: Self-pay | Admitting: Medical-Surgical

## 2022-08-03 ENCOUNTER — Other Ambulatory Visit (HOSPITAL_BASED_OUTPATIENT_CLINIC_OR_DEPARTMENT_OTHER): Payer: Self-pay

## 2022-08-03 MED ORDER — QUVIVIQ 25 MG PO TABS: 25.0000 mg | ORAL_TABLET | Freq: Every evening | ORAL | 0 refills | Status: DC | PRN

## 2022-08-03 MED ORDER — WEGOVY 0.5 MG/0.5ML ~~LOC~~ SOAJ
0.5000 mg | SUBCUTANEOUS | 0 refills | Status: DC
  Filled 2022-08-03: qty 2, 28d supply, fill #0

## 2022-08-03 MED ORDER — QUVIVIQ 50 MG PO TABS
50.0000 mg | ORAL_TABLET | Freq: Every evening | ORAL | 2 refills | Status: DC
  Filled 2022-08-03: qty 30, 30d supply, fill #0
  Filled 2022-08-26: qty 30, 30d supply, fill #1
  Filled 2022-09-29: qty 30, 30d supply, fill #2

## 2022-08-03 NOTE — Telephone Encounter (Signed)
Pharmacy sent a request for refill on Quviviq.   Last OV: 06/23/22 Last Fill: 07/01/22

## 2022-08-04 ENCOUNTER — Other Ambulatory Visit (HOSPITAL_BASED_OUTPATIENT_CLINIC_OR_DEPARTMENT_OTHER): Payer: Self-pay

## 2022-08-05 ENCOUNTER — Other Ambulatory Visit (HOSPITAL_BASED_OUTPATIENT_CLINIC_OR_DEPARTMENT_OTHER): Payer: Self-pay

## 2022-08-09 ENCOUNTER — Encounter: Payer: Self-pay | Admitting: Physical Medicine & Rehabilitation

## 2022-08-15 ENCOUNTER — Other Ambulatory Visit: Payer: Self-pay | Admitting: Medical-Surgical

## 2022-08-16 ENCOUNTER — Other Ambulatory Visit (HOSPITAL_BASED_OUTPATIENT_CLINIC_OR_DEPARTMENT_OTHER): Payer: Self-pay

## 2022-08-16 MED ORDER — ONDANSETRON 8 MG PO TBDP
8.0000 mg | ORAL_TABLET | Freq: Three times a day (TID) | ORAL | 3 refills | Status: DC | PRN
  Filled 2022-08-16: qty 20, 7d supply, fill #0
  Filled 2022-08-26: qty 20, 7d supply, fill #1
  Filled 2022-09-29: qty 20, 7d supply, fill #2
  Filled 2022-10-20 – 2022-10-29 (×3): qty 20, 7d supply, fill #3

## 2022-08-26 ENCOUNTER — Other Ambulatory Visit: Payer: Self-pay | Admitting: Medical-Surgical

## 2022-08-26 DIAGNOSIS — R41840 Attention and concentration deficit: Secondary | ICD-10-CM

## 2022-08-27 ENCOUNTER — Other Ambulatory Visit (HOSPITAL_BASED_OUTPATIENT_CLINIC_OR_DEPARTMENT_OTHER): Payer: Self-pay

## 2022-08-27 MED ORDER — WEGOVY 0.5 MG/0.5ML ~~LOC~~ SOAJ
0.5000 mg | SUBCUTANEOUS | 0 refills | Status: DC
  Filled 2022-08-27 – 2022-09-03 (×2): qty 2, 28d supply, fill #0

## 2022-08-30 ENCOUNTER — Other Ambulatory Visit (HOSPITAL_BASED_OUTPATIENT_CLINIC_OR_DEPARTMENT_OTHER): Payer: Self-pay

## 2022-08-30 MED ORDER — LISDEXAMFETAMINE DIMESYLATE 40 MG PO CAPS
40.0000 mg | ORAL_CAPSULE | Freq: Every day | ORAL | 0 refills | Status: DC
  Filled 2022-08-30 – 2022-09-01 (×2): qty 30, 30d supply, fill #0

## 2022-08-31 ENCOUNTER — Other Ambulatory Visit (HOSPITAL_BASED_OUTPATIENT_CLINIC_OR_DEPARTMENT_OTHER): Payer: Self-pay

## 2022-09-01 ENCOUNTER — Other Ambulatory Visit (HOSPITAL_BASED_OUTPATIENT_CLINIC_OR_DEPARTMENT_OTHER): Payer: Self-pay

## 2022-09-03 ENCOUNTER — Ambulatory Visit: Payer: 59 | Admitting: Podiatry

## 2022-09-03 ENCOUNTER — Other Ambulatory Visit (HOSPITAL_BASED_OUTPATIENT_CLINIC_OR_DEPARTMENT_OTHER): Payer: Self-pay

## 2022-09-16 ENCOUNTER — Other Ambulatory Visit (HOSPITAL_BASED_OUTPATIENT_CLINIC_OR_DEPARTMENT_OTHER): Payer: Self-pay

## 2022-09-24 ENCOUNTER — Encounter: Payer: Self-pay | Admitting: Medical-Surgical

## 2022-09-24 ENCOUNTER — Ambulatory Visit: Payer: 59 | Admitting: Medical-Surgical

## 2022-09-24 ENCOUNTER — Encounter: Payer: 59 | Attending: Physical Medicine & Rehabilitation | Admitting: Physical Medicine & Rehabilitation

## 2022-09-24 ENCOUNTER — Encounter: Payer: Self-pay | Admitting: Physical Medicine & Rehabilitation

## 2022-09-24 ENCOUNTER — Other Ambulatory Visit (HOSPITAL_BASED_OUTPATIENT_CLINIC_OR_DEPARTMENT_OTHER): Payer: Self-pay

## 2022-09-24 ENCOUNTER — Other Ambulatory Visit: Payer: Self-pay | Admitting: Medical-Surgical

## 2022-09-24 VITALS — BP 114/84 | HR 83 | Ht 71.0 in | Wt 279.0 lb

## 2022-09-24 VITALS — BP 152/81 | HR 103 | Resp 20 | Ht 71.0 in | Wt 280.1 lb

## 2022-09-24 DIAGNOSIS — M47816 Spondylosis without myelopathy or radiculopathy, lumbar region: Secondary | ICD-10-CM | POA: Insufficient documentation

## 2022-09-24 DIAGNOSIS — F5104 Psychophysiologic insomnia: Secondary | ICD-10-CM

## 2022-09-24 DIAGNOSIS — F419 Anxiety disorder, unspecified: Secondary | ICD-10-CM

## 2022-09-24 DIAGNOSIS — M533 Sacrococcygeal disorders, not elsewhere classified: Secondary | ICD-10-CM | POA: Diagnosis not present

## 2022-09-24 DIAGNOSIS — R41840 Attention and concentration deficit: Secondary | ICD-10-CM | POA: Insufficient documentation

## 2022-09-24 MED ORDER — CLONAZEPAM 1 MG PO TABS
1.0000 mg | ORAL_TABLET | Freq: Two times a day (BID) | ORAL | 2 refills | Status: DC | PRN
  Filled 2022-09-24 – 2022-09-28 (×2): qty 60, 30d supply, fill #0
  Filled 2022-10-20 – 2022-10-29 (×2): qty 60, 30d supply, fill #1
  Filled 2022-11-24 – 2022-11-29 (×2): qty 60, 30d supply, fill #2

## 2022-09-24 MED ORDER — TIRZEPATIDE 2.5 MG/0.5ML ~~LOC~~ SOAJ: 2.5000 mg | SUBCUTANEOUS | 0 refills | Status: DC

## 2022-09-24 MED ORDER — LISDEXAMFETAMINE DIMESYLATE 50 MG PO CAPS
50.0000 mg | ORAL_CAPSULE | Freq: Every day | ORAL | 0 refills | Status: DC
  Filled 2022-09-24: qty 30, 30d supply, fill #0

## 2022-09-24 NOTE — Progress Notes (Signed)
Subjective:    Patient ID: Jared Tran, male    DOB: Aug 03, 1971, 51 y.o.   MRN: [id number]  HPI  CC:  Chronic low back pain  51 year old male referred by orthospine for chronic low back pain.  The patient indicates that his pain has been occurring through most of his adult life.  He gives a history of competitive power lifting.  He has tried multiple treatments starting with conservative care proceeding through medication management and interventional spine procedures.  He has undergone orthospine evaluation with Dr. Christell Constant and was not felt to be a good surgical candidate given the relative lack of lower extremity symptomatology.  Recent MRI of the lumbar spine has been reviewed report as well as images Has had RF procedures mainly at [facility] which have been very helpful  No relief with NSAIDs  Has done PT and continues with HEP   Patient also goes to the gym and works out with weights on the weekends.  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 General activity 9 Relation with others 9 Enjoyment of life 10 What TIME of day is your pain at its worst? morning  and night Sleep (in general) Poor  Pain is worse with: bending, standing, and some activites Pain improves with:  spinal ablasions Relief from Meds: 0  Do you have any goals in this area?  yes  employed # of hrs/week .  No problems in this area  Any changes since last visit?  no  Any changes since last visit?  no    Family History  Problem Relation Age of Onset   Deep vein thrombosis Father    Pulmonary embolism Father    Seizures Father    Sleep apnea Father    Diabetes Paternal Uncle    Alzheimer's disease Maternal Grandmother    Skin cancer Paternal Grandmother    Alzheimer's  disease Paternal Grandmother    Heart attack Paternal Grandfather    Colon cancer Neg Hx    Esophageal cancer Neg Hx    Social History   Socioeconomic History   Marital status: Married    Spouse name: Not on file   Number of children: 2   Years of education: Not on file   Highest education level: Not on file  Occupational History   Occupation: IT department   Tobacco Use   Smoking status: Former    Types: Cigarettes    Quit date: 08/01/1999    Years since quitting: 23.1   Smokeless tobacco: Never  Vaping Use   Vaping Use: Never used  Substance and Sexual Activity   Alcohol use: Yes    Comment: Occasionally   Drug use: Never   Sexual activity: Not Currently    Partners: Female  Other Topics Concern   Not on file  Social History Narrative   Not on file   Social Determinants of Health   Financial Resource Strain: Not on file  Food Insecurity: Not on file  Transportation Needs: Not on file  Physical Activity: Not on file  Stress: Not on file  Social Connections: Not on file   Past Surgical History:  Procedure Laterality Date   ARTHROSCOPIC REPAIR ACL     BACK SURGERY  CHOLECYSTECTOMY     COLONOSCOPY     Last one age 10 [facility]   ESOPHAGOGASTRODUODENOSCOPY     since age 82 last one was in 2010 with Dr Aura Camps   KNEE SURGERY     LAPAROSCOPIC GASTRIC BAND REMOVAL WITH LAPAROSCOPIC GASTRIC SLEEVE RESECTION     SHOULDER SURGERY     Past Medical History:  Diagnosis Date   ADD (attention deficit disorder)    Anemia    Anxiety    Arthritis    Asthma    Delayed sleep phase syndrome    Elevated cholesterol    GERD (gastroesophageal reflux disease)    Hiatal hernia    History of colon polyps    History of gallstones    Obesity    Presbyopia    Sleep apnea    Ht 5\' 11"  (1.803 m)   Wt 279 lb (126.6 kg)   BMI 38.91 kg/m   Opioid Risk Score:   Fall Risk Score:  `1  Depression screen [facility] 2/9     09/24/2022   11:12 AM 09/24/2022    9:47 AM  06/23/2022   10:08 AM 02/16/2022    2:23 PM 01/29/2022    9:18 AM 08/24/2021    2:21 PM 08/14/2020    9:14 AM  Depression screen PHQ 2/9  Decreased Interest 2 2 1  0 0 2 0  Down, Depressed, Hopeless 0 0 0 0 0 0 0  PHQ - 2 Score 2 2 1  0 0 2 0  Altered sleeping 3 1 3   3    Tired, decreased energy 2 3 3   3    Change in appetite 1 1 2   3    Feeling bad or failure about yourself  0 0 0   0   Trouble concentrating 0 0 2   2   Moving slowly or fidgety/restless 0 0 2   1   Suicidal thoughts 0 0 0   0   PHQ-9 Score 8 7 13   14    Difficult doing work/chores Somewhat difficult Somewhat difficult Somewhat difficult   Somewhat difficult       Review of Systems  Musculoskeletal:  Positive for back pain.  All other systems reviewed and are negative.     Objective:   Physical Exam  Well-developed well-nourished male no acute distress Mood and affect are appropriate Motor strength is 5/5 bilateral hip flexor knee extensor ankle dorsiflexor Negative straight leg raising bilaterally Deep tendon reflexes 2+ bilateral knees and ankles Ambulates without assistive device no evidence of toe drag or knee instability Sensation normal bilateral lower extremities to light touch Positive tenderness over the left SI area also tenderness over the left greater than right lumbosacral junction. Excess lordosis noted Negative distraction test Positive Faber's on the right side but not on the left positive thigh thrust test right greater than left      Assessment & Plan:   #1.  Chronic low back pain pain is mainly around the lumbosacral junction he does have some positive provocative test for SI but also has facet mediated pain.  Given his prior excellent results with lumbar facet radiofrequency neurotomy and the fact that it has been approximately 4 years since last procedure would recommend lumbar medial branch blocks to help distinguish lumbar facet versus sacroiliac pain generator. Continue home exercise  program Do not think another round of physical therapy is needed given his prior treatments as well as his ongoing home exercise program. No further imaging  needed MRI lumbar spine dated 07/06/2022 is adequate

## 2022-09-24 NOTE — Progress Notes (Signed)
Established Patient Office Visit  Subjective   Patient ID: Jared Tran, male   DOB: 10-17-1970 Age: 51 y.o. MRN: [id number]   Chief Complaint  Patient presents with   ADHD   Follow-up   HPI Pleasant 51 year old male presenting today for the following:  Anxiety: Continues to use clonazepam 1-2 mg daily to manage anxiety.  Sometimes is able to skip a couple of days but this is not often.  Now that he is working from home, he uses 1 mg daily on more days than he uses the higher dose.  Attention deficit: Taking Vyvanse 40 mg daily, tolerating well without side effects.  Feels the medication is working well for him however he is noticing the meds wear off in the afternoon.  Interested in possibly increasing the dose.  Insomnia: Taking Quviviq 50 mg nightly to help with sleep.  Continues to use ZzzQuil but has been able to cut back to half the dose.  Feels this combination works well to get him to sleep and keep him there.  Has noted an increase in libido about an hour after taking his medication that persists in the morning.  Weight concerns: Has been using Wegovy but is having difficulty getting it from his pharmacy.  He spoke with them about what they have in stock but these are only the higher doses.  He was advised that there is a new medication out for weight loss that he is interested in trying.   Objective:    Vitals:   09/24/22 0920  BP: (!) 152/81  Pulse: (!) 103  Resp: 20  Height: 5\' 11"  (1.803 m)  Weight: 280 lb 1.6 oz (127.1 kg)  SpO2: 98%  BMI (Calculated): 39.08    Physical Exam Vitals reviewed.  Constitutional:      General: He is not in acute distress.    Appearance: Normal appearance. He is obese. He is not ill-appearing.  HENT:     Head: Normocephalic.  Cardiovascular:     Rate and Rhythm: Normal rate.     Pulses: Normal pulses.     Heart sounds: Normal heart sounds. No murmur heard.    No friction rub. No gallop.  Pulmonary:     Effort: Pulmonary  effort is normal. No respiratory distress.     Breath sounds: Normal breath sounds.  Skin:    General: Skin is warm and dry.  Neurological:     Mental Status: He is alert and oriented to person, place, and time.  Psychiatric:        Mood and Affect: Mood normal.        Behavior: Behavior normal.        Thought Content: Thought content normal.        Judgment: Judgment normal.    No results found for this or any previous visit (from the past 24 hour(s)).     The 10-year ASCVD risk score (Arnett DK, et al., 2019) is: 7.5%   Values used to calculate the score:     Age: 51 years     Sex: Male     Is Non-Hispanic African American: No     Diabetic: No     Tobacco smoker: No     Systolic Blood Pressure: 152 mmHg     Is BP treated: No     HDL Cholesterol: 54 mg/dL     Total Cholesterol: 247 mg/dL   Assessment & Plan:   1. Anxiety Continue to limit use of clonazepam as much  as possible.  Ultimately would like to get this down to no more than 1 mg daily.  Strongly feel that starting a daily maintenance medication would be helpful in controlling anxiety but he continues to be hesitant to try this.  2. Attention or concentration deficit Increasing Vyvanse to 50 mg daily.  Discussed possibly adding a small dose of instant release medication in the afternoon but he would like to try the increased dose of Vyvanse first.  I will touch base with him in 3 weeks via MyChart to see how he is doing on this dose. - lisdexamfetamine (VYVANSE) 50 MG capsule; Take 1 capsule (50 mg total) by mouth daily.  Dispense: 30 capsule; Refill: 0  3. Psychophysiological insomnia Continue Quviviq 50 mg nightly.  Continue to work on discontinuing ZzzQuil as this is not recommended long-term.  4. Morbid obesity (HCC) Sending in Zepbound 2.5mg  for 4 weeks.  If tolerating well at that time, we will go ahead and increase to the 5 mg dose.  Return in about 3 months (around 12/24/2022) for Anxiety/attention deficit  follow-up.  ___________________________________________ Thayer Ohm, DNP, APRN, FNP-BC Primary Care and Sports Medicine [facility] [place]

## 2022-09-28 ENCOUNTER — Encounter: Payer: Self-pay | Admitting: Physical Medicine & Rehabilitation

## 2022-09-28 ENCOUNTER — Other Ambulatory Visit (HOSPITAL_BASED_OUTPATIENT_CLINIC_OR_DEPARTMENT_OTHER): Payer: Self-pay

## 2022-09-29 ENCOUNTER — Other Ambulatory Visit (HOSPITAL_BASED_OUTPATIENT_CLINIC_OR_DEPARTMENT_OTHER): Payer: Self-pay

## 2022-09-30 ENCOUNTER — Other Ambulatory Visit (HOSPITAL_BASED_OUTPATIENT_CLINIC_OR_DEPARTMENT_OTHER): Payer: Self-pay

## 2022-09-30 MED FILL — Tirzepatide Soln Auto-injector 2.5 MG/0.5ML: SUBCUTANEOUS | 28 days supply | Qty: 2 | Fill #0 | Status: AC

## 2022-10-01 ENCOUNTER — Encounter: Payer: Self-pay | Admitting: Medical-Surgical

## 2022-10-01 ENCOUNTER — Other Ambulatory Visit (HOSPITAL_BASED_OUTPATIENT_CLINIC_OR_DEPARTMENT_OTHER): Payer: Self-pay

## 2022-10-04 ENCOUNTER — Other Ambulatory Visit (HOSPITAL_BASED_OUTPATIENT_CLINIC_OR_DEPARTMENT_OTHER): Payer: Self-pay

## 2022-10-07 ENCOUNTER — Other Ambulatory Visit (HOSPITAL_BASED_OUTPATIENT_CLINIC_OR_DEPARTMENT_OTHER): Payer: Self-pay

## 2022-10-08 ENCOUNTER — Other Ambulatory Visit (HOSPITAL_BASED_OUTPATIENT_CLINIC_OR_DEPARTMENT_OTHER): Payer: Self-pay

## 2022-10-12 ENCOUNTER — Other Ambulatory Visit (HOSPITAL_BASED_OUTPATIENT_CLINIC_OR_DEPARTMENT_OTHER): Payer: Self-pay

## 2022-10-13 ENCOUNTER — Other Ambulatory Visit (HOSPITAL_BASED_OUTPATIENT_CLINIC_OR_DEPARTMENT_OTHER): Payer: Self-pay

## 2022-10-14 ENCOUNTER — Other Ambulatory Visit (HOSPITAL_BASED_OUTPATIENT_CLINIC_OR_DEPARTMENT_OTHER): Payer: Self-pay

## 2022-10-15 ENCOUNTER — Other Ambulatory Visit (HOSPITAL_BASED_OUTPATIENT_CLINIC_OR_DEPARTMENT_OTHER): Payer: Self-pay

## 2022-10-18 ENCOUNTER — Encounter: Payer: Self-pay | Admitting: Medical-Surgical

## 2022-10-19 ENCOUNTER — Other Ambulatory Visit (HOSPITAL_BASED_OUTPATIENT_CLINIC_OR_DEPARTMENT_OTHER): Payer: Self-pay

## 2022-10-19 ENCOUNTER — Telehealth: Payer: Self-pay

## 2022-10-19 NOTE — Telephone Encounter (Addendum)
Initiated Prior authorization QIW:[id number] 2.5MG /0.5ML pen-injectors Via: Covermymeds Case/Key: [id number] Status: approved  as of 10/19/22 Reason:The authorization is effective from 10/19/2022 to 10/19/2023  Notified Pt via: Mychart

## 2022-10-20 ENCOUNTER — Other Ambulatory Visit: Payer: Self-pay | Admitting: Medical-Surgical

## 2022-10-20 ENCOUNTER — Other Ambulatory Visit (HOSPITAL_BASED_OUTPATIENT_CLINIC_OR_DEPARTMENT_OTHER): Payer: Self-pay

## 2022-10-20 DIAGNOSIS — R41840 Attention and concentration deficit: Secondary | ICD-10-CM

## 2022-10-20 MED ORDER — LISDEXAMFETAMINE DIMESYLATE 50 MG PO CAPS
50.0000 mg | ORAL_CAPSULE | Freq: Every day | ORAL | 0 refills | Status: DC
  Filled 2022-10-25 – 2022-10-29 (×2): qty 30, 30d supply, fill #0

## 2022-10-21 ENCOUNTER — Other Ambulatory Visit (HOSPITAL_BASED_OUTPATIENT_CLINIC_OR_DEPARTMENT_OTHER): Payer: Self-pay

## 2022-10-25 ENCOUNTER — Other Ambulatory Visit (HOSPITAL_BASED_OUTPATIENT_CLINIC_OR_DEPARTMENT_OTHER): Payer: Self-pay

## 2022-10-29 ENCOUNTER — Other Ambulatory Visit (HOSPITAL_BASED_OUTPATIENT_CLINIC_OR_DEPARTMENT_OTHER): Payer: Self-pay

## 2022-10-29 ENCOUNTER — Other Ambulatory Visit: Payer: Self-pay | Admitting: Medical-Surgical

## 2022-11-01 MED ORDER — QUVIVIQ 50 MG PO TABS: 50.0000 mg | ORAL_TABLET | Freq: Every evening | ORAL | 2 refills | Status: DC

## 2022-11-01 NOTE — Progress Notes (Signed)
  PROCEDURE RECORD [place] [facility]   Name: Cobi Delph DOB:06-22-1971 MRN: [id number]  Date:11/01/2022  Physician: Alysia Penna, MD    Nurse/CMA: Jorja Loa MA  Allergies:  Allergies  Allergen Reactions   Penicillins Hives and Rash   Hydrocodone-Acetaminophen Itching and Nausea Only    Consent Signed: Yes.    Is patient diabetic? No.  CBG today? na  Pregnant: No. LMP: No LMP for male patient. (age 54-55)  Anticoagulants: no Anti-inflammatory: no Antibiotics: no  Procedure: Bilateral L3-4-5 Medial Branch Block  Position: Prone Start Time: 10:48 am  End Time: 11:01 am  Fluoro Time: 1:09  RN/CMA Jaleen Grupp MA Val Schiavo MA    Time 10:25 PM 11:07 am    BP 126/90 122/82    Pulse 74 74    Respirations 18 16    O2 Sat 97 98    S/S 6 6    Pain Level 6/10 0/10     D/C home with Self, patient A & O X 3, D/C instructions reviewed, and sits independently.

## 2022-11-04 ENCOUNTER — Other Ambulatory Visit (HOSPITAL_BASED_OUTPATIENT_CLINIC_OR_DEPARTMENT_OTHER): Payer: Self-pay

## 2022-11-04 ENCOUNTER — Other Ambulatory Visit: Payer: Self-pay | Admitting: Medical-Surgical

## 2022-11-05 ENCOUNTER — Encounter
Payer: Commercial Managed Care - PPO | Attending: Physical Medicine & Rehabilitation | Admitting: Physical Medicine & Rehabilitation

## 2022-11-05 ENCOUNTER — Other Ambulatory Visit: Payer: Self-pay

## 2022-11-05 ENCOUNTER — Other Ambulatory Visit (HOSPITAL_COMMUNITY): Payer: Self-pay

## 2022-11-05 ENCOUNTER — Other Ambulatory Visit (HOSPITAL_BASED_OUTPATIENT_CLINIC_OR_DEPARTMENT_OTHER): Payer: Self-pay

## 2022-11-05 ENCOUNTER — Other Ambulatory Visit: Payer: Self-pay | Admitting: Medical-Surgical

## 2022-11-05 ENCOUNTER — Encounter: Payer: Self-pay | Admitting: Physical Medicine & Rehabilitation

## 2022-11-05 VITALS — BP 126/90 | HR 74 | Ht 71.0 in | Wt 280.0 lb

## 2022-11-05 DIAGNOSIS — M47817 Spondylosis without myelopathy or radiculopathy, lumbosacral region: Secondary | ICD-10-CM

## 2022-11-05 MED ORDER — LIDOCAINE HCL 1 % IJ SOLN
10.0000 mL | Freq: Once | INTRAMUSCULAR | Status: AC
  Administered 2022-11-05: 10 mL

## 2022-11-05 MED ORDER — QUVIVIQ 50 MG PO TABS
50.0000 mg | ORAL_TABLET | Freq: Every evening | ORAL | 2 refills | Status: DC
  Filled 2022-11-05 – 2022-11-10 (×3): qty 30, 30d supply, fill #0
  Filled 2022-12-15 – 2022-12-20 (×2): qty 30, 30d supply, fill #1

## 2022-11-05 MED ORDER — LIDOCAINE HCL (PF) 2 % IJ SOLN
3.0000 mL | Freq: Once | INTRAMUSCULAR | Status: AC
  Administered 2022-11-05: 3 mL

## 2022-11-05 MED ORDER — IOHEXOL 180 MG/ML  SOLN
3.0000 mL | Freq: Once | INTRAMUSCULAR | Status: AC
  Administered 2022-11-05: 3 mL

## 2022-11-05 NOTE — Telephone Encounter (Signed)
Last prescription was printed and never sent to the pharmacy. Last visit 09/24/22

## 2022-11-05 NOTE — Patient Instructions (Signed)

## 2022-11-05 NOTE — Progress Notes (Signed)
Bilateral Lumbar L3, L4  medial branch blocks and L 5 dorsal ramus injection under fluoroscopic guidance  Indication: Lumbar pain which is not relieved by medication management or other conservative care and interfering with self-care and mobility.  Informed consent was obtained after describing risks and benefits of the procedure with the patient, this includes bleeding, infection, paralysis and medication side effects.  The patient wishes to proceed and has given written consent.  The patient was placed in prone position.  The lumbar area was marked and prepped with Betadine.  One mL of 1% lidocaine was injected into each of 6 areas into the skin and subcutaneous tissue.  Then a 22-gauge 5" spinal needle was inserted targeting the junction of the left S1 superior articular process and sacral ala junction. Needle was advanced under fluoroscopic guidance.  Bone contact was made.Omnipaque 180 was injected x 0.5 mL demonstrating no intravascular uptake.  Then a solution  of 2% MPF lidocaine was injected x 0.5 mL.  Then the left L5 superior articular process in transverse process junction was targeted.  Bone contact was made. Omnipaque 180 was injected x 0.5 mL demonstrating no intravascular uptake. Then a solution containing  2% MPF lidocaine was injected x 0.5 mL.  Then the left L4 superior articular process in transverse process junction was targeted.  Bone contact was made. Omnipaque 180 was injected x 0.5 mL demonstrating no intravascular uptake.  Then a solution containing2% MPF lidocaine was injected x 0.5 mL.  This same procedure was performed on the right side using the same needle, technique and injectate.  Patient tolerated procedure well.  Post procedure instructions were given.   Lidocaine 1% with preservative multidose, 16m no waste Lidocaine 2% MPF, 539mbottle, 66m60msed 2ml92mste Omnipaque 180 1.5 ml used, 8.5 ml waste

## 2022-11-11 ENCOUNTER — Other Ambulatory Visit (HOSPITAL_BASED_OUTPATIENT_CLINIC_OR_DEPARTMENT_OTHER): Payer: Self-pay

## 2022-11-11 ENCOUNTER — Telehealth: Payer: Self-pay

## 2022-11-11 NOTE — Telephone Encounter (Addendum)
Initiated Prior authorization [id number] 50MG tablets Via: Covermymeds Case/Key:[id number]  Status: approved  as of 11/11/22 Reason:The authorization is effective from 11/11/2022 to 05/12/2023 Notified Pt via: Mychart

## 2022-11-12 ENCOUNTER — Other Ambulatory Visit (HOSPITAL_BASED_OUTPATIENT_CLINIC_OR_DEPARTMENT_OTHER): Payer: Self-pay

## 2022-11-16 ENCOUNTER — Other Ambulatory Visit: Payer: Self-pay | Admitting: Medical-Surgical

## 2022-11-17 ENCOUNTER — Other Ambulatory Visit (HOSPITAL_BASED_OUTPATIENT_CLINIC_OR_DEPARTMENT_OTHER): Payer: Self-pay

## 2022-11-17 MED ORDER — MOUNJARO 2.5 MG/0.5ML ~~LOC~~ SOAJ
2.5000 mg | SUBCUTANEOUS | 0 refills | Status: DC
  Filled 2022-11-17: qty 2, 28d supply, fill #0

## 2022-11-24 ENCOUNTER — Other Ambulatory Visit: Payer: Self-pay

## 2022-11-24 ENCOUNTER — Other Ambulatory Visit (HOSPITAL_BASED_OUTPATIENT_CLINIC_OR_DEPARTMENT_OTHER): Payer: Self-pay

## 2022-11-24 ENCOUNTER — Other Ambulatory Visit: Payer: Self-pay | Admitting: Medical-Surgical

## 2022-11-24 DIAGNOSIS — R41840 Attention and concentration deficit: Secondary | ICD-10-CM

## 2022-11-24 MED ORDER — LISDEXAMFETAMINE DIMESYLATE 50 MG PO CAPS
50.0000 mg | ORAL_CAPSULE | Freq: Every day | ORAL | 0 refills | Status: DC
  Filled 2022-11-24: qty 30, 30d supply, fill #0

## 2022-11-24 MED ORDER — ONDANSETRON 8 MG PO TBDP
8.0000 mg | ORAL_TABLET | Freq: Three times a day (TID) | ORAL | 3 refills | Status: DC | PRN
  Filled 2022-11-24: qty 20, 7d supply, fill #0
  Filled 2022-12-15: qty 20, 7d supply, fill #1
  Filled 2023-01-25: qty 20, 7d supply, fill #2
  Filled 2023-02-22: qty 20, 7d supply, fill #3

## 2022-11-25 ENCOUNTER — Other Ambulatory Visit (HOSPITAL_BASED_OUTPATIENT_CLINIC_OR_DEPARTMENT_OTHER): Payer: Self-pay

## 2022-11-26 ENCOUNTER — Other Ambulatory Visit (HOSPITAL_BASED_OUTPATIENT_CLINIC_OR_DEPARTMENT_OTHER): Payer: Self-pay

## 2022-11-29 ENCOUNTER — Other Ambulatory Visit: Payer: Self-pay

## 2022-11-30 ENCOUNTER — Other Ambulatory Visit (HOSPITAL_BASED_OUTPATIENT_CLINIC_OR_DEPARTMENT_OTHER): Payer: Self-pay

## 2022-12-01 ENCOUNTER — Other Ambulatory Visit (HOSPITAL_BASED_OUTPATIENT_CLINIC_OR_DEPARTMENT_OTHER): Payer: Self-pay

## 2022-12-07 ENCOUNTER — Other Ambulatory Visit (HOSPITAL_BASED_OUTPATIENT_CLINIC_OR_DEPARTMENT_OTHER): Payer: Self-pay

## 2022-12-09 ENCOUNTER — Encounter
Payer: Commercial Managed Care - PPO | Attending: Physical Medicine & Rehabilitation | Admitting: Physical Medicine & Rehabilitation

## 2022-12-09 ENCOUNTER — Encounter: Payer: Self-pay | Admitting: Physical Medicine & Rehabilitation

## 2022-12-09 VITALS — BP 144/83 | HR 84 | Ht 71.0 in | Wt 276.0 lb

## 2022-12-09 DIAGNOSIS — M47817 Spondylosis without myelopathy or radiculopathy, lumbosacral region: Secondary | ICD-10-CM | POA: Insufficient documentation

## 2022-12-09 MED ORDER — LIDOCAINE HCL 1 % IJ SOLN
10.0000 mL | Freq: Once | INTRAMUSCULAR | Status: AC
  Administered 2022-12-09: 10 mL

## 2022-12-09 MED ORDER — LIDOCAINE HCL (PF) 2 % IJ SOLN
5.0000 mL | Freq: Once | INTRAMUSCULAR | Status: AC
  Administered 2022-12-09: 5 mL

## 2022-12-09 NOTE — Patient Instructions (Signed)
You had a radio frequency procedure today This was done to alleviate joint pain in your lumbar area We injected lidocaine which is a local anesthetic.  You may experience soreness at the injection sites. You may also experienced some irritation of the nerves that were heated I'm recommending ice for 30 minutes every 2 hours as needed for the next 24-48 hours   

## 2022-12-09 NOTE — Progress Notes (Signed)
  PROCEDURE RECORD [facility]   Name: Jared Tran DOB:November 30, 1970 MRN: [id number]  Date:12/09/2022  Physician: Alysia Penna, MD    Nurse/CMA: Broc Caspers S  Allergies:  Allergies  Allergen Reactions   Penicillins Hives and Rash   Hydrocodone-Acetaminophen Itching and Nausea Only    Consent Signed: Yes.    Is patient diabetic? No.  CBG today? N/a  Pregnant: No. LMP: No LMP for male patient. (age 54-55)  Anticoagulants: no Anti-inflammatory: no Antibiotics: no  Procedure: B/L L3-4-5 Radiofrequency  Position: Prone Start Time: 10:32  End Time: 11;01  Fluoro Time: 1.24  RN/CMA Nohlan Burdin S Bronsen Serano S    Time 10:15 11:09    BP 144/83 123/83    Pulse 84 78    Respirations 16 16    O2 Sat 98 99    S/S 6 6    Pain Level 6 0     D/C home with father, patient A & O X 3, D/C instructions reviewed, and sits independently.

## 2022-12-09 NOTE — Progress Notes (Signed)
Left and RIght L5 dorsal ramus., left L4 and left L3 medial branch radio frequency neurotomy under fluoroscopic guidance  Indication: Low back pain due to lumbar spondylosis which has been relieved on 2 occasions by greater than 50% by lumbar medial branch blocks at corresponding levels.  Informed consent was obtained after describing risks and benefits of the procedure with the patient, this includes bleeding, bruising, infection, paralysis and medication side effects. The patient wishes to proceed and has given written consent. The patient was placed in a prone position. The lumbar and sacral area was marked and prepped with Betadine. A 25-gauge 1-1/2 inch needle was inserted into the skin and subcutaneous tissue at 3 sites in one ML of 1% lidocaine was injected into each site. Then a 18-gauge 10 cm radio frequency needle with a 1 cm curved active tip was inserted targeting the left S1 SAP/sacral ala junction. Bone contact was made and confirmed with lateral imaging.  motor stimulation at 2 Hz confirm proper needle location followed by injection of one ML of 2% MPF lidocaine. Then the left L5 SAP/transverse process junction was targeted. Bone contact was made and confirmed with lateral imaging motor stimulation at 2 Hz confirm proper needle location followed by injection of one ML of the solution containing one ML of  2% MPF lidocaine. Then the left L4 SAP/transverse process junction was targeted. Bone contact was made and confirmed with lateral imaging. motor stimulation at 2 Hz confirm proper needle location followed by injection of one ML of the solution containing one ML of2% MPF lidocaine. Radio frequency lesion  at [facility] for 90 seconds was performed. Needles were removed.Same procedure was repeated on the right side using same needle injectate and technique.   Post procedure instructions and vital signs were performed. Patient tolerated procedure well. Followup appointment was given.

## 2022-12-15 ENCOUNTER — Other Ambulatory Visit: Payer: Self-pay | Admitting: Medical-Surgical

## 2022-12-16 ENCOUNTER — Other Ambulatory Visit (HOSPITAL_BASED_OUTPATIENT_CLINIC_OR_DEPARTMENT_OTHER): Payer: Self-pay

## 2022-12-16 ENCOUNTER — Other Ambulatory Visit: Payer: Self-pay

## 2022-12-17 ENCOUNTER — Other Ambulatory Visit (HOSPITAL_BASED_OUTPATIENT_CLINIC_OR_DEPARTMENT_OTHER): Payer: Self-pay

## 2022-12-17 MED ORDER — MOUNJARO 2.5 MG/0.5ML ~~LOC~~ SOAJ
2.5000 mg | SUBCUTANEOUS | 0 refills | Status: DC
  Filled 2022-12-17: qty 2, 28d supply, fill #0

## 2022-12-20 ENCOUNTER — Other Ambulatory Visit (HOSPITAL_BASED_OUTPATIENT_CLINIC_OR_DEPARTMENT_OTHER): Payer: Self-pay

## 2022-12-27 ENCOUNTER — Encounter: Payer: Self-pay | Admitting: Medical-Surgical

## 2022-12-27 ENCOUNTER — Other Ambulatory Visit (HOSPITAL_BASED_OUTPATIENT_CLINIC_OR_DEPARTMENT_OTHER): Payer: Self-pay

## 2022-12-27 ENCOUNTER — Ambulatory Visit: Payer: Commercial Managed Care - PPO | Admitting: Medical-Surgical

## 2022-12-27 VITALS — BP 108/68 | HR 88 | Resp 20 | Ht 71.0 in | Wt 274.9 lb

## 2022-12-27 DIAGNOSIS — R41840 Attention and concentration deficit: Secondary | ICD-10-CM | POA: Diagnosis not present

## 2022-12-27 DIAGNOSIS — K591 Functional diarrhea: Secondary | ICD-10-CM | POA: Diagnosis not present

## 2022-12-27 DIAGNOSIS — F419 Anxiety disorder, unspecified: Secondary | ICD-10-CM

## 2022-12-27 DIAGNOSIS — F5104 Psychophysiologic insomnia: Secondary | ICD-10-CM | POA: Diagnosis not present

## 2022-12-27 DIAGNOSIS — Z7689 Persons encountering health services in other specified circumstances: Secondary | ICD-10-CM | POA: Diagnosis not present

## 2022-12-27 DIAGNOSIS — J069 Acute upper respiratory infection, unspecified: Secondary | ICD-10-CM | POA: Diagnosis not present

## 2022-12-27 MED ORDER — ALBUTEROL SULFATE HFA 108 (90 BASE) MCG/ACT IN AERS
2.0000 | INHALATION_SPRAY | Freq: Four times a day (QID) | RESPIRATORY_TRACT | 11 refills | Status: DC | PRN
  Filled 2022-12-27: qty 6.7, 20d supply, fill #0
  Filled 2022-12-30 – 2023-01-25 (×2): qty 6.7, 20d supply, fill #1

## 2022-12-27 MED ORDER — QUVIVIQ 50 MG PO TABS
50.0000 mg | ORAL_TABLET | Freq: Every evening | ORAL | 5 refills | Status: DC
  Filled 2022-12-27 – 2023-01-25 (×2): qty 30, 30d supply, fill #0
  Filled 2023-02-22: qty 30, 30d supply, fill #1
  Filled 2023-03-24: qty 30, 30d supply, fill #2
  Filled 2023-04-24: qty 30, 30d supply, fill #3
  Filled 2023-05-31: qty 30, 30d supply, fill #4

## 2022-12-27 MED ORDER — CLONAZEPAM 1 MG PO TABS
1.0000 mg | ORAL_TABLET | Freq: Two times a day (BID) | ORAL | 5 refills | Status: DC | PRN
  Filled 2022-12-27 – 2022-12-30 (×2): qty 60, 30d supply, fill #0
  Filled 2023-01-25: qty 60, 30d supply, fill #1
  Filled 2023-02-22: qty 60, 30d supply, fill #2
  Filled 2023-03-24: qty 60, 30d supply, fill #3
  Filled 2023-04-24: qty 60, 30d supply, fill #4
  Filled 2023-05-31: qty 60, 30d supply, fill #5

## 2022-12-27 MED ORDER — TIRZEPATIDE 5 MG/0.5ML ~~LOC~~ SOAJ
5.0000 mg | SUBCUTANEOUS | 1 refills | Status: DC
  Filled 2022-12-27 – 2023-01-10 (×2): qty 2, 28d supply, fill #0
  Filled 2023-01-25: qty 2, 28d supply, fill #1
  Filled ????-??-??: fill #1

## 2022-12-27 MED ORDER — LISDEXAMFETAMINE DIMESYLATE 60 MG PO CAPS
60.0000 mg | ORAL_CAPSULE | ORAL | 0 refills | Status: DC
  Filled 2022-12-27: qty 30, 30d supply, fill #0

## 2022-12-27 NOTE — Patient Instructions (Signed)
Medications & Home Remedies for Upper Respiratory Illness   Note: the following list assumes no pregnancy, normal liver & kidney function and no other drug interactions. Dr. Alexander has highlighted medications which are safe for you to use, but these may not be appropriate for everyone. Always ask a pharmacist or qualified medical provider if you have any questions!    Aches/Pains, Fever, Headache OTC Acetaminophen (Tylenol) 500 mg tablets - take max 2 tablets (1000 mg) every 6 hours (4 times per day)  OTC Ibuprofen (Motrin) 200 mg tablets - take max 4 tablets (800 mg) every 6 hours*   Sinus Congestion Prescription Atrovent as directed OTC Nasal Saline if desired to rinse OTC Oxymetolazone (Afrin, others) sparing use due to rebound congestion, NEVER use in kids OTC Phenylephrine (Sudafed) 10 mg tablets every 4 hours (or the 12-hour formulation)* OTC Diphenhydramine (Benadryl) 25 mg tablets - take max 2 tablets every 4 hours   Cough & Sore Throat Prescription cough pills or syrups as directed OTC Dextromethorphan (Robitussin, others) - cough suppressant OTC Guaifenesin (Robitussin, Mucinex, others) - expectorant (helps cough up mucus) (Dextromethorphan and Guaifenesin also come in a combination tablet/syrup) OTC Lozenges w/ Benzocaine + Menthol (Cepacol) Honey - as much as you want! Teas which "coat the throat" - look for ingredients Elm Bark, Licorice Root, Marshmallow Root   Other Prescription Oral Steroids to decrease inflammation and improve energy Prescription Antibiotics if these are necessary for bacterial infection - take ALL, even if you're feeling better  OTC Zinc Lozenges within 24 hours of symptoms onset - mixed evidence this shortens the duration of the common cold Don't waste your money on Vitamin C or Echinacea in acute illness - it's already too late!    *Caution in patients with high blood pressure   

## 2022-12-27 NOTE — Assessment & Plan Note (Signed)
Continues to have issues with sleeping.  Using Quviviq 50 mg nightly which seems to be helpful however he is still taking ZzzQuil every night on top of the medication.  Without it, he reports that he is unable to get to sleep because the medication does not kick in fast enough.  No excessive grogginess and he is aware that he needs to cut out the use of ZzzQuil/Benadryl.  He will continue working on this.  For now continue Quviviq 50mg  nightly and work on sleep hygiene.

## 2022-12-27 NOTE — Assessment & Plan Note (Signed)
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  Has found that taking Darcel Bayley is very helpful for this.  Without the medication, he has been experiencing fecal incontinence and fecal urgency.  Since starting the [facility], he has had no issues with either.  Plan to continue Mounjaro at 5 mg weekly over the next 3 months.  Discussed that this may not be enough to qualify him for continuation of the medication but should we have to stop it, we can certainly look at other options.

## 2022-12-27 NOTE — Assessment & Plan Note (Addendum)
Reports that he was exposed to upper respiratory illness approximately 6 days ago when he took his children to taekwondo practice.  Since then his son and daughter have been sick with upper respiratory symptoms.  His symptoms started on Saturday (2 days ago) including rhinorrhea, sinus congestion, cough, body aches, chest tightness, wheezing, and postnasal drip.  On exam, his lungs are clear without wheezing, rhonchi, or rails.  Clear postnasal drip, posterior cobblestoning, and mild swelling of the tonsils noted.  At this point, his presentation is consistent with a viral illness.  Recommend conservative measures including over-the-counter cough and cold preparations.  Adding albuterol inhaler to help with chest tightness and wheezing.  OTC viral URI med list provided with AVS.

## 2022-12-27 NOTE — Assessment & Plan Note (Signed)
Has been using Mounjaro 2.5 mg weekly, tolerating well without side effects.  Has seen continued slow weight loss over the last couple of months and is interested in increasing the medication.  Notes a decrease in appetite and is working on Materials engineer.  Activity as tolerated.  Discussed insurance coverage for the medication as it will be coming off at the end of April.  Sending Mounjaro 5 mg weekly for 16-month supply.  Once we have come to the end of that, we will need to look at other options.

## 2022-12-27 NOTE — Assessment & Plan Note (Signed)
Has been treated with clonazepam for greater than 20 years.  Currently taking 1-2 mg every morning with a 1 mg dose in the evening.  Has been able to skip days at a time where he does not need this but this is most often when he is not having to go to work.  Most often takes 2 mg every morning on Mondays and the rest of the days he does okay with 1 mg.  Continues to be resistant to starting a maintenance medication due to perceived negative effects on testosterone.  Ultimately would like to reduce the use of clonazepam but this will likely take months.  For now continue with recommended dosing of 1 mg twice daily as needed.

## 2022-12-27 NOTE — Assessment & Plan Note (Signed)
Doing well on Vyvanse at 50 mg daily.  Does have issues with tongue biting/chewing.  These are worse with the Vyvanse at 40 mg daily and were then accompanied by jaw clenching.  The increase to 50 mg seems to have been helpful.  The medication works well to help him focus and he is able to get things done at work without excessive distractions.  No change in appetite, sleep pattern, or weight.  Increasing Vyvanse to 60 mg daily for 1 month to see if this is helpful to reduce the tongue biting/chewing behavior.  If not, we will certainly revert back to 50 mg daily.

## 2022-12-27 NOTE — Progress Notes (Signed)
Established patient visit  History, exam, impression, and plan:  Anxiety Has been treated with clonazepam for greater than 20 years.  Currently taking 1-2 mg every morning with a 1 mg dose in the evening.  Has been able to skip days at a time where he does not need this but this is most often when he is not having to go to work.  Most often takes 2 mg every morning on Mondays and the rest of the days he does okay with 1 mg.  Continues to be resistant to starting a maintenance medication due to perceived negative effects on testosterone.  Ultimately would like to reduce the use of clonazepam but this will likely take months.  For now continue with recommended dosing of 1 mg twice daily as needed.  Attention or concentration deficit Doing well on Vyvanse at 50 mg daily.  Does have issues with tongue biting/chewing.  These are worse with the Vyvanse at 40 mg daily and were then accompanied by jaw clenching.  The increase to 50 mg seems to have been helpful.  The medication works well to help him focus and he is able to get things done at work without excessive distractions.  No change in appetite, sleep pattern, or weight.  Increasing Vyvanse to 60 mg daily for 1 month to see if this is helpful to reduce the tongue biting/chewing behavior.  If not, we will certainly revert back to 50 mg daily.  Encounter for weight management Has been using Mounjaro 2.5 mg weekly, tolerating well without side effects.  Has seen continued slow weight loss over the last couple of months and is interested in increasing the medication.  Notes a decrease in appetite and is working on Materials engineer.  Activity as tolerated.  Discussed insurance coverage for the medication as it will be coming off at the end of April.  Sending Mounjaro 5 mg weekly for 58-month supply.  Once we have come to the end of that, we will need to look at other options.  Psychophysiological insomnia Continues to have issues with  sleeping.  Using Quviviq 50 mg nightly which seems to be helpful however he is still taking ZzzQuil every night on top of the medication.  Without it, he reports that he is unable to get to sleep because the medication does not kick in fast enough.  No excessive grogginess and he is aware that he needs to cut out the use of ZzzQuil/Benadryl.  He will continue working on this.  For now continue Quviviq 50mg  nightly and work on sleep hygiene.   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  Has found that taking Darcel Bayley is very helpful for this.  Without the medication, he has been experiencing fecal incontinence and fecal urgency.  Since starting the [facility], he has had no issues with either.  Plan to continue Mounjaro at 5 mg weekly over the next 3 months.  Discussed that this may not be enough to qualify him for continuation of the medication but should we have to stop it, we can certainly look at other options.  Viral URI with cough Reports that he was exposed to upper respiratory illness approximately 6 days ago when he took his children to taekwondo practice.  Since then his son and daughter have been sick with upper respiratory symptoms.  His symptoms started on Saturday (2 days ago) including rhinorrhea, sinus congestion, cough, body aches, chest tightness, wheezing,  and postnasal drip.  On exam, his lungs are clear without wheezing, rhonchi, or rails.  Clear postnasal drip, posterior cobblestoning, and mild swelling of the tonsils noted.  At this point, his presentation is consistent with a viral illness.  Recommend conservative measures including over-the-counter cough and cold preparations.  Adding albuterol inhaler to help with chest tightness and wheezing.  OTC viral URI med list provided with AVS.  Procedures performed this visit: None.  Return in about 6 months (around 06/28/2023) for chronic disease follow  up.  __________________________________ Clearnce Sorrel, DNP, APRN, FNP-BC [facility] [place]

## 2022-12-30 ENCOUNTER — Other Ambulatory Visit: Payer: Self-pay

## 2022-12-30 ENCOUNTER — Other Ambulatory Visit (HOSPITAL_BASED_OUTPATIENT_CLINIC_OR_DEPARTMENT_OTHER): Payer: Self-pay

## 2022-12-31 ENCOUNTER — Other Ambulatory Visit (HOSPITAL_BASED_OUTPATIENT_CLINIC_OR_DEPARTMENT_OTHER): Payer: Self-pay

## 2023-01-10 ENCOUNTER — Encounter: Payer: Self-pay | Admitting: *Deleted

## 2023-01-10 ENCOUNTER — Other Ambulatory Visit (HOSPITAL_BASED_OUTPATIENT_CLINIC_OR_DEPARTMENT_OTHER): Payer: Self-pay

## 2023-01-17 ENCOUNTER — Encounter: Payer: Self-pay | Admitting: Medical-Surgical

## 2023-01-25 ENCOUNTER — Other Ambulatory Visit: Payer: Self-pay | Admitting: Medical-Surgical

## 2023-01-25 ENCOUNTER — Other Ambulatory Visit: Payer: Self-pay

## 2023-01-25 ENCOUNTER — Other Ambulatory Visit (HOSPITAL_BASED_OUTPATIENT_CLINIC_OR_DEPARTMENT_OTHER): Payer: Self-pay

## 2023-01-25 MED ORDER — TIRZEPATIDE 7.5 MG/0.5ML ~~LOC~~ SOAJ
7.5000 mg | SUBCUTANEOUS | 0 refills | Status: DC
  Filled 2023-01-25 – 2023-02-02 (×3): qty 2, 28d supply, fill #0
  Filled 2023-03-16: qty 2, 28d supply, fill #1
  Filled 2023-04-24: qty 2, 28d supply, fill #2

## 2023-01-25 MED ORDER — LISDEXAMFETAMINE DIMESYLATE 60 MG PO CAPS
60.0000 mg | ORAL_CAPSULE | ORAL | 0 refills | Status: DC
  Filled 2023-01-25 – 2023-01-27 (×2): qty 30, 30d supply, fill #0

## 2023-01-26 ENCOUNTER — Other Ambulatory Visit (HOSPITAL_BASED_OUTPATIENT_CLINIC_OR_DEPARTMENT_OTHER): Payer: Self-pay

## 2023-01-27 ENCOUNTER — Other Ambulatory Visit (HOSPITAL_BASED_OUTPATIENT_CLINIC_OR_DEPARTMENT_OTHER): Payer: Self-pay

## 2023-02-02 ENCOUNTER — Other Ambulatory Visit (HOSPITAL_BASED_OUTPATIENT_CLINIC_OR_DEPARTMENT_OTHER): Payer: Self-pay

## 2023-02-22 ENCOUNTER — Other Ambulatory Visit: Payer: Self-pay

## 2023-02-22 ENCOUNTER — Other Ambulatory Visit: Payer: Self-pay | Admitting: Medical-Surgical

## 2023-02-22 ENCOUNTER — Other Ambulatory Visit (HOSPITAL_BASED_OUTPATIENT_CLINIC_OR_DEPARTMENT_OTHER): Payer: Self-pay

## 2023-02-22 MED ORDER — LISDEXAMFETAMINE DIMESYLATE 60 MG PO CAPS
60.0000 mg | ORAL_CAPSULE | ORAL | 0 refills | Status: DC
  Filled 2023-02-22: qty 10, 10d supply, fill #0
  Filled 2023-02-24: qty 30, 30d supply, fill #0

## 2023-02-22 NOTE — Telephone Encounter (Signed)
Please contact patient to have him scheduled for a follow-up visit in 2 months to follow-up on Vyvanse.  Refill sent for 60 mg generic dose.

## 2023-02-23 ENCOUNTER — Other Ambulatory Visit (HOSPITAL_BASED_OUTPATIENT_CLINIC_OR_DEPARTMENT_OTHER): Payer: Self-pay

## 2023-02-24 ENCOUNTER — Telehealth: Payer: Self-pay

## 2023-02-24 ENCOUNTER — Other Ambulatory Visit (HOSPITAL_BASED_OUTPATIENT_CLINIC_OR_DEPARTMENT_OTHER): Payer: Self-pay

## 2023-02-24 MED ORDER — LISDEXAMFETAMINE DIMESYLATE 60 MG PO CAPS
60.0000 mg | ORAL_CAPSULE | ORAL | 0 refills | Status: DC
  Filled 2023-02-24: qty 90, 90d supply, fill #0

## 2023-02-24 NOTE — Telephone Encounter (Signed)
He has been scheduled.

## 2023-02-24 NOTE — Telephone Encounter (Signed)
Patient scheduled.

## 2023-02-24 NOTE — Telephone Encounter (Signed)
Vyvanse sent in.   CM

## 2023-02-24 NOTE — Telephone Encounter (Signed)
Thanks

## 2023-02-24 NOTE — Telephone Encounter (Signed)
Jared Tran is wanting a 90 day of Vyvanse instead of the 30 day.

## 2023-02-25 ENCOUNTER — Other Ambulatory Visit (HOSPITAL_BASED_OUTPATIENT_CLINIC_OR_DEPARTMENT_OTHER): Payer: Self-pay

## 2023-03-15 NOTE — Progress Notes (Signed)
Erroneus encounter /please disregard

## 2023-03-16 ENCOUNTER — Other Ambulatory Visit (HOSPITAL_BASED_OUTPATIENT_CLINIC_OR_DEPARTMENT_OTHER): Payer: Self-pay

## 2023-03-24 ENCOUNTER — Other Ambulatory Visit (HOSPITAL_BASED_OUTPATIENT_CLINIC_OR_DEPARTMENT_OTHER): Payer: Self-pay

## 2023-04-24 ENCOUNTER — Other Ambulatory Visit: Payer: Self-pay | Admitting: Medical-Surgical

## 2023-04-25 ENCOUNTER — Other Ambulatory Visit: Payer: Self-pay

## 2023-04-26 ENCOUNTER — Other Ambulatory Visit (HOSPITAL_BASED_OUTPATIENT_CLINIC_OR_DEPARTMENT_OTHER): Payer: Self-pay

## 2023-04-26 MED ORDER — ONDANSETRON 8 MG PO TBDP
8.0000 mg | ORAL_TABLET | Freq: Three times a day (TID) | ORAL | 3 refills | Status: DC | PRN
  Filled 2023-04-26: qty 20, 7d supply, fill #0
  Filled 2023-05-31: qty 20, 7d supply, fill #1
  Filled 2023-06-15: qty 20, 7d supply, fill #2
  Filled 2023-07-10: qty 20, 7d supply, fill #3

## 2023-04-28 ENCOUNTER — Other Ambulatory Visit (HOSPITAL_BASED_OUTPATIENT_CLINIC_OR_DEPARTMENT_OTHER): Payer: Self-pay

## 2023-05-03 ENCOUNTER — Encounter: Payer: Self-pay | Admitting: Medical-Surgical

## 2023-05-10 ENCOUNTER — Encounter: Payer: Commercial Managed Care - PPO | Admitting: Physical Medicine & Rehabilitation

## 2023-05-18 ENCOUNTER — Encounter: Payer: Self-pay | Admitting: Medical-Surgical

## 2023-05-18 ENCOUNTER — Ambulatory Visit (INDEPENDENT_AMBULATORY_CARE_PROVIDER_SITE_OTHER): Payer: Commercial Managed Care - PPO | Admitting: Medical-Surgical

## 2023-05-18 ENCOUNTER — Other Ambulatory Visit (HOSPITAL_BASED_OUTPATIENT_CLINIC_OR_DEPARTMENT_OTHER): Payer: Self-pay

## 2023-05-18 VITALS — BP 115/79 | HR 80 | Ht 71.0 in | Wt 254.1 lb

## 2023-05-18 DIAGNOSIS — G4733 Obstructive sleep apnea (adult) (pediatric): Secondary | ICD-10-CM

## 2023-05-18 DIAGNOSIS — E6609 Other obesity due to excess calories: Secondary | ICD-10-CM | POA: Diagnosis not present

## 2023-05-18 DIAGNOSIS — F5104 Psychophysiologic insomnia: Secondary | ICD-10-CM | POA: Diagnosis not present

## 2023-05-18 DIAGNOSIS — Z0000 Encounter for general adult medical examination without abnormal findings: Secondary | ICD-10-CM | POA: Diagnosis not present

## 2023-05-18 DIAGNOSIS — R41840 Attention and concentration deficit: Secondary | ICD-10-CM | POA: Diagnosis not present

## 2023-05-18 DIAGNOSIS — Z8249 Family history of ischemic heart disease and other diseases of the circulatory system: Secondary | ICD-10-CM | POA: Diagnosis not present

## 2023-05-18 DIAGNOSIS — R1909 Other intra-abdominal and pelvic swelling, mass and lump: Secondary | ICD-10-CM | POA: Diagnosis not present

## 2023-05-18 DIAGNOSIS — Z6835 Body mass index (BMI) 35.0-35.9, adult: Secondary | ICD-10-CM

## 2023-05-18 DIAGNOSIS — E291 Testicular hypofunction: Secondary | ICD-10-CM | POA: Diagnosis not present

## 2023-05-18 DIAGNOSIS — F419 Anxiety disorder, unspecified: Secondary | ICD-10-CM

## 2023-05-18 DIAGNOSIS — Z23 Encounter for immunization: Secondary | ICD-10-CM | POA: Diagnosis not present

## 2023-05-18 MED ORDER — TIRZEPATIDE 10 MG/0.5ML ~~LOC~~ SOAJ
10.0000 mg | SUBCUTANEOUS | 1 refills | Status: DC
Start: 2023-05-18 — End: 2023-09-29
  Filled 2023-05-18: qty 2, 28d supply, fill #0
  Filled 2023-06-15: qty 2, 28d supply, fill #1
  Filled 2023-07-10: qty 2, 28d supply, fill #2
  Filled 2023-07-26 – 2023-08-02 (×2): qty 2, 28d supply, fill #3
  Filled 2023-08-29: qty 2, 28d supply, fill #4
  Filled 2023-09-25: qty 2, 28d supply, fill #5

## 2023-05-18 MED ORDER — LISDEXAMFETAMINE DIMESYLATE 70 MG PO CAPS
70.0000 mg | ORAL_CAPSULE | Freq: Every day | ORAL | 0 refills | Status: DC
Start: 2023-05-18 — End: 2023-06-01
  Filled 2023-05-18: qty 30, 30d supply, fill #0

## 2023-05-18 NOTE — Progress Notes (Unsigned)
Complete physical exam  Patient: Jared Tran   DOB: 01-08-1971   52 y.o. Male  MRN: [id number]  Subjective:    Chief Complaint  Patient presents with   Annual Exam   Jared Tran is a 52 y.o. male who presents today for a complete physical exam. He reports consuming a general diet. {types:19826} He generally feels well. He reports sleeping {DESC; WELL/FAIRLY WELL/POORLY:18703}. He does not have additional problems to discuss today.    Most recent fall risk assessment:    05/18/2023    9:28 AM  Fall Risk   Falls in the past year? 0  Number falls in past yr: 0  Injury with Fall? 0  Risk for fall due to : No Fall Risks  Follow up Falls evaluation completed     Most recent depression screenings:    05/18/2023    9:28 AM 11/05/2022   10:21 AM  PHQ 2/9 Scores  PHQ - 2 Score 2 0  PHQ- 9 Score 7     Vision:{Last Opthalmologist Visit:27382}, Dental: {CHL AMB ROS DENTAL (Optional):27383::"No current dental problems"}, STD: {CHL AMB STD HX (Optional):27384}, and PSA: {CHL AMB PSA (Optional):27385}  {History (Optional):23778}  Patient Care Team: Christen Butter, NP as PCP - General (Nurse Practitioner)   Outpatient Medications Prior to Visit  Medication Sig   AMBULATORY NON FORMULARY MEDICATION Continuous positive airway pressure (CPAP): Patient already has the machine (see below) Please provide all supplemental supplies as needed.  Mask: ResMed AirTouch P20 (Size:Large)    CPAP is a : Engineer, manufacturing systems [employer]  [phone]  fax [phone] (We offer on-call services after hours and on weekends) [address] [place], [place] [address]   calcium-vitamin D (OSCAL WITH D) 500-200 MG-UNIT TABS tablet Take 1 tablet by mouth daily.   clonazePAM (KLONOPIN) 1 MG tablet Take 1 tablet (1 mg total) by mouth 2 (two) times daily as needed for anxiety.   Cyanocobalamin (VITAMIN B-12) 5000 MCG TBDP Take 1 tablet by mouth daily.    Daridorexant HCl (QUVIVIQ) 50 MG TABS Take 50 mg by mouth at bedtime.   Ferrous Sulfate (IRON PO) Take 1 tablet by mouth daily.   lidocaine (LMX) 4 % cream Apply 1 application topically daily as needed.   lisdexamfetamine (VYVANSE) 60 MG capsule Take 1 capsule (60 mg total) by mouth every morning.   MULTIPLE VITAMIN PO Take 1 tablet by mouth 2 (two) times daily.   ondansetron (ZOFRAN-ODT) 8 MG disintegrating tablet Take 1 tablet (8 mg total) by mouth every 8 (eight) hours as needed for nausea.   tirzepatide (MOUNJARO) 7.5 MG/0.5ML Pen Inject 7.5 mg into the skin once a week.   [DISCONTINUED] albuterol (VENTOLIN HFA) 108 (90 Base) MCG/ACT inhaler Inhale 2 puffs into the lungs every 6 (six) hours as needed for wheezing.   No facility-administered medications prior to visit.    Review of Systems  Constitutional:  Negative for chills, fever, malaise/fatigue and weight loss.  HENT:  Negative for congestion, ear pain, hearing loss, sinus pain and sore throat.   Eyes:  Negative for blurred vision, photophobia and pain.  Respiratory:  Negative for cough, shortness of breath and wheezing.   Cardiovascular:  Negative for chest pain, palpitations and leg swelling.  Gastrointestinal:  Positive for constipation and diarrhea. Negative for abdominal pain, heartburn, nausea and vomiting.  Genitourinary:  Negative for dysuria, frequency and urgency.  Musculoskeletal:  Negative for falls and neck pain.  Inguinal mass   Skin:  Negative for itching and rash.  Neurological:  Negative for dizziness, weakness and headaches.  Endo/Heme/Allergies:  Negative for polydipsia. Does not bruise/bleed easily.  Psychiatric/Behavioral:  Negative for depression, substance abuse and suicidal ideas. The patient is nervous/anxious and has insomnia (Intermittent).      Objective:    BP 115/79   Pulse 80   Ht 5\' 11"  (1.803 m)   Wt 254 lb 1.9 oz (115.3 kg)   SpO2 100%   BMI 35.44 kg/m  {Vitals History  (Optional):23777}  Physical Exam Vitals reviewed.  Constitutional:      General: He is not in acute distress.    Appearance: Normal appearance. He is obese. He is not ill-appearing.  HENT:     Head: Normocephalic and atraumatic.     Right Ear: Tympanic membrane, ear canal and external ear normal. There is no impacted cerumen.     Left Ear: Tympanic membrane, ear canal and external ear normal. There is no impacted cerumen.     Nose: Nose normal. No congestion or rhinorrhea.     Mouth/Throat:     Mouth: Mucous membranes are moist.     Pharynx: No oropharyngeal exudate or posterior oropharyngeal erythema.  Eyes:     General: No scleral icterus.       Right eye: No discharge.        Left eye: No discharge.     Extraocular Movements: Extraocular movements intact.     Conjunctiva/sclera: Conjunctivae normal.     Pupils: Pupils are equal, round, and reactive to light.  Neck:     Thyroid: No thyromegaly.     Vascular: No carotid bruit or JVD.     Trachea: Trachea normal.  Cardiovascular:     Rate and Rhythm: Normal rate and regular rhythm.     Pulses: Normal pulses.     Heart sounds: Normal heart sounds. No murmur heard.    No friction rub. No gallop.  Pulmonary:     Effort: Pulmonary effort is normal. No respiratory distress.     Breath sounds: Normal breath sounds. No wheezing.  Abdominal:     General: Bowel sounds are normal. There is no distension.     Palpations: Abdomen is soft.     Tenderness: There is no abdominal tenderness. There is no guarding.  Musculoskeletal:        General: Normal range of motion.     Cervical back: Normal range of motion and neck supple.  Lymphadenopathy:     Cervical: No cervical adenopathy.  Skin:    General: Skin is warm and dry.  Neurological:     Mental Status: He is alert and oriented to person, place, and time.     Cranial Nerves: No cranial nerve deficit.  Psychiatric:        Mood and Affect: Mood normal.        Behavior: Behavior  normal.        Thought Content: Thought content normal.        Judgment: Judgment normal.      No results found for any visits on 05/18/23. {Show previous labs (optional):23779}    Assessment & Plan:    Routine Health Maintenance and Physical Exam  Immunization History  Administered Date(s) Administered   Influenza Split 07/01/2011, 06/09/2017   Influenza, High Dose Seasonal PF 07/12/2014   Influenza, Quadrivalent, Recombinant, Inj, Pf 07/08/2018   Influenza,inj,Quad PF,6+ Mos 07/13/2014, 07/07/2021, 06/23/2022   Influenza,inj,Quad PF,6-35 Mos 06/30/2015, 08/21/2019  Influenza,inj,quad, With Preservative 07/11/2013, 07/13/2014, 06/23/2016   Influenza,trivalent, recombinat, inj, PF 07/08/2018   PFIZER(Purple Top)SARS-COV-2 Vaccination 12/18/2019, 01/15/2020, 09/12/2020, 05/05/2021   Tdap 07/01/2011, 03/16/2020   Zoster Recombinant(Shingrix) 10/29/2019, 02/14/2020    Health Maintenance  Topic Date Due   Colonoscopy  08/01/2019   COVID-19 Vaccine (5 - 2023-24 season) 05/28/2022   INFLUENZA VACCINE  04/28/2023   DTaP/Tdap/Td (3 - Td or Tdap) 03/16/2030   Hepatitis C Screening  Completed   HIV Screening  Completed   Zoster Vaccines- Shingrix  Completed   HPV VACCINES  Aged Out   Discussed health benefits of physical activity, and encouraged him to engage in regular exercise appropriate for his age and condition.  Problem List Items Addressed This Visit     Anxiety   Attention or concentration deficit   OSA on CPAP   Psychophysiological insomnia   Other Visit Diagnoses     Annual physical exam    -  Primary      No follow-ups on file.     Christen Butter, NP

## 2023-05-19 ENCOUNTER — Encounter: Payer: Self-pay | Admitting: Medical-Surgical

## 2023-05-19 DIAGNOSIS — R790 Abnormal level of blood mineral: Secondary | ICD-10-CM

## 2023-05-19 DIAGNOSIS — Z8249 Family history of ischemic heart disease and other diseases of the circulatory system: Secondary | ICD-10-CM

## 2023-05-20 NOTE — Addendum Note (Signed)
Addended byChristen Butter on: 05/20/2023 01:11 PM   Modules accepted: Level of Service

## 2023-05-25 ENCOUNTER — Ambulatory Visit (INDEPENDENT_AMBULATORY_CARE_PROVIDER_SITE_OTHER): Payer: Commercial Managed Care - PPO

## 2023-05-25 DIAGNOSIS — R1909 Other intra-abdominal and pelvic swelling, mass and lump: Secondary | ICD-10-CM

## 2023-05-25 DIAGNOSIS — Z23 Encounter for immunization: Secondary | ICD-10-CM

## 2023-06-01 ENCOUNTER — Other Ambulatory Visit (HOSPITAL_BASED_OUTPATIENT_CLINIC_OR_DEPARTMENT_OTHER): Payer: Self-pay

## 2023-06-01 ENCOUNTER — Telehealth: Payer: Self-pay | Admitting: Medical-Surgical

## 2023-06-01 ENCOUNTER — Other Ambulatory Visit: Payer: Self-pay

## 2023-06-01 MED ORDER — LISDEXAMFETAMINE DIMESYLATE 40 MG PO CAPS
40.0000 mg | ORAL_CAPSULE | ORAL | 0 refills | Status: DC
  Filled 2023-06-01: qty 30, 30d supply, fill #0

## 2023-06-01 MED ORDER — DULOXETINE HCL 30 MG PO CPEP
30.0000 mg | ORAL_CAPSULE | Freq: Every day | ORAL | 1 refills | Status: DC
  Filled 2023-06-01: qty 90, 90d supply, fill #0
  Filled 2023-07-20: qty 90, 90d supply, fill #1

## 2023-06-01 NOTE — Telephone Encounter (Signed)
Initiated Prior authorization for: Quviviq Via: Covermymeds Case/Key: [id number] Status: Pending as of  06/01/23  Notified Pt via: Mychart

## 2023-06-01 NOTE — Addendum Note (Signed)
Addended byChristen Butter on: 06/01/2023 07:59 PM   Modules accepted: Orders

## 2023-06-01 NOTE — Addendum Note (Signed)
Addended byChristen Butter on: 06/01/2023 12:35 PM   Modules accepted: Orders

## 2023-06-02 ENCOUNTER — Other Ambulatory Visit (HOSPITAL_BASED_OUTPATIENT_CLINIC_OR_DEPARTMENT_OTHER): Payer: Self-pay

## 2023-06-02 ENCOUNTER — Other Ambulatory Visit: Payer: Self-pay

## 2023-06-03 ENCOUNTER — Other Ambulatory Visit: Payer: Self-pay | Admitting: Medical-Surgical

## 2023-06-03 DIAGNOSIS — E291 Testicular hypofunction: Secondary | ICD-10-CM | POA: Diagnosis not present

## 2023-06-03 DIAGNOSIS — Z8249 Family history of ischemic heart disease and other diseases of the circulatory system: Secondary | ICD-10-CM | POA: Diagnosis not present

## 2023-06-03 DIAGNOSIS — Z0000 Encounter for general adult medical examination without abnormal findings: Secondary | ICD-10-CM | POA: Diagnosis not present

## 2023-06-03 DIAGNOSIS — R790 Abnormal level of blood mineral: Secondary | ICD-10-CM | POA: Diagnosis not present

## 2023-06-03 DIAGNOSIS — Z125 Encounter for screening for malignant neoplasm of prostate: Secondary | ICD-10-CM

## 2023-06-03 NOTE — Addendum Note (Signed)
Addended byChristen Butter on: 06/03/2023 08:15 AM   Modules accepted: Orders

## 2023-06-04 LAB — LIPID PANEL
Chol/HDL Ratio: 4.6 ratio (ref 0.0–5.0)
Cholesterol, Total: 231 mg/dL — ABNORMAL HIGH (ref 100–199)
HDL: 50 mg/dL (ref >39)
LDL Chol Calc (NIH): 160 mg/dL — ABNORMAL HIGH (ref 0–99)
Triglycerides: 116 mg/dL (ref 0–149)
VLDL Cholesterol Cal: 21 mg/dL (ref 5–40)

## 2023-06-04 LAB — CMP14+EGFR
ALT: 14 IU/L (ref 0–44)
AST: 21 IU/L (ref 0–40)
Albumin: 4.6 g/dL (ref 3.8–4.9)
Alkaline Phosphatase: 67 IU/L (ref 44–121)
BUN/Creatinine Ratio: 10 (ref 9–20)
BUN: 11 mg/dL (ref 6–24)
Bilirubin Total: 1 mg/dL (ref 0.0–1.2)
CO2: 23 mmol/L (ref 20–29)
Calcium: 9.3 mg/dL (ref 8.7–10.2)
Chloride: 102 mmol/L (ref 96–106)
Creatinine, Ser: 1.08 mg/dL (ref 0.76–1.27)
Globulin, Total: 2.5 g/dL (ref 1.5–4.5)
Glucose: 73 mg/dL (ref 70–99)
Potassium: 3.9 mmol/L (ref 3.5–5.2)
Sodium: 139 mmol/L (ref 134–144)
Total Protein: 7.1 g/dL (ref 6.0–8.5)
eGFR: 82 mL/min/{1.73_m2} (ref >59)

## 2023-06-04 LAB — CBC WITH DIFFERENTIAL/PLATELET
Basophils Absolute: 0 10*3/uL (ref 0.0–0.2)
Basos: 0 %
EOS (ABSOLUTE): 0.2 10*3/uL (ref 0.0–0.4)
Eos: 3 %
Hematocrit: 46.2 % (ref 37.5–51.0)
Hemoglobin: 15.2 g/dL (ref 13.0–17.7)
Immature Grans (Abs): 0 10*3/uL (ref 0.0–0.1)
Immature Granulocytes: 0 %
Lymphocytes Absolute: 0.9 10*3/uL (ref 0.7–3.1)
Lymphs: 19 %
MCH: 31 pg (ref 26.6–33.0)
MCHC: 32.9 g/dL (ref 31.5–35.7)
MCV: 94 fL (ref 79–97)
Monocytes Absolute: 0.5 10*3/uL (ref 0.1–0.9)
Monocytes: 10 %
Neutrophils Absolute: 3.1 10*3/uL (ref 1.4–7.0)
Neutrophils: 68 %
Platelets: 135 10*3/uL — ABNORMAL LOW (ref 150–450)
RBC: 4.9 x10E6/uL (ref 4.14–5.80)
RDW: 12 % (ref 11.6–15.4)
WBC: 4.6 10*3/uL (ref 3.4–10.8)

## 2023-06-04 LAB — IRON,TIBC AND FERRITIN PANEL
Ferritin: 288 ng/mL (ref 30–400)
Iron Saturation: 37 % (ref 15–55)
Iron: 102 ug/dL (ref 38–169)
Total Iron Binding Capacity: 274 ug/dL (ref 250–450)
UIBC: 172 ug/dL (ref 111–343)

## 2023-06-04 LAB — PROTIME-INR
INR: 1 (ref 0.9–1.2)
Prothrombin Time: 11.7 s (ref 9.1–12.0)

## 2023-06-04 LAB — PSA: Prostate Specific Ag, Serum: 0.9 ng/mL (ref 0.0–4.0)

## 2023-06-08 LAB — TESTOSTERONE, FREE, TOTAL, SHBG
Sex Hormone Binding: 60.7 nmol/L (ref 19.3–76.4)
Testosterone, Free: 4.2 pg/mL — ABNORMAL LOW (ref 7.2–24.0)
Testosterone: 477 ng/dL (ref 264–916)

## 2023-06-13 ENCOUNTER — Ambulatory Visit: Payer: Commercial Managed Care - PPO | Admitting: Medical-Surgical

## 2023-06-13 ENCOUNTER — Encounter: Payer: Self-pay | Admitting: Medical-Surgical

## 2023-06-13 VITALS — BP 102/66 | HR 77 | Resp 20 | Ht 71.0 in | Wt 234.0 lb

## 2023-06-13 DIAGNOSIS — R55 Syncope and collapse: Secondary | ICD-10-CM

## 2023-06-13 DIAGNOSIS — R0989 Other specified symptoms and signs involving the circulatory and respiratory systems: Secondary | ICD-10-CM | POA: Diagnosis not present

## 2023-06-13 LAB — POCT INFLUENZA A/B
Influenza A, POC: NEGATIVE
Influenza B, POC: NEGATIVE

## 2023-06-13 LAB — POC COVID19 BINAXNOW: SARS Coronavirus 2 Ag: NEGATIVE

## 2023-06-13 NOTE — Progress Notes (Signed)
        Established patient visit  History, exam, impression, and plan:  1. Syncope and collapse Pleasant 52 year old male presenting today for evaluation after having several episodes of syncope and collapse over the weekend while hiking at [facility] with his son. Reports waking on Saturday morning with ST, cough, chills, and sinus congestion which got better after breakfast. They set out on a trail that they hiked last year. During the hike, he reports at least 4 syncopal episodes with LOC. These episodes were accompanied by dizziness, vision changes, nausea, SOB, and a pounding heart. Endorses "a little" chest pain but feels this may have been the need for  heavy use of the walking pole. Each episode of syncope resolved quickly without intervention. Did not seek medical attention. Of note, has been taking Cymbalta as prescribed and worries that the symptoms may be from the medication. Stopped the medication three days ago. Also on Mounjaro at 10mg  weekly and notes very heavy appetite suppression leading to poor PO intake. Wt loss of 20 lbs in 3 weeks. Drinking fluids well. In office EKG showing rate of 70, NSR, normal axis. Negative orthostatic vitals. Checking labs as below. Suspect a combination of a viral URI along with malnutrition and increased physical exertion causing hypotension leading to syncope. Recommend patient monitor BP and glucose at home especially if symptomatic. Discussed Cymbalta, do not feel that this was related but ok to hold and plan to order GeneSight for further information.  - CBC - CMP14+EGFR - D-Dimer, Quantitative - TSH  2. Respiratory symptoms 3 days of upper respiratory symptoms as noted above.  Negative for flu and COVID.  Suspect simple viral URI.  Recommend conservative measures. - POCT Influenza A/B - POC COVID-19   Procedures performed this visit: None.  Return if symptoms worsen or fail to improve.  __________________________________ Thayer Ohm, DNP, APRN, FNP-BC Primary Care and Sports Medicine [facility]

## 2023-06-14 DIAGNOSIS — R55 Syncope and collapse: Secondary | ICD-10-CM | POA: Diagnosis not present

## 2023-06-15 DIAGNOSIS — H524 Presbyopia: Secondary | ICD-10-CM | POA: Diagnosis not present

## 2023-06-15 DIAGNOSIS — H5203 Hypermetropia, bilateral: Secondary | ICD-10-CM | POA: Diagnosis not present

## 2023-06-15 DIAGNOSIS — H52223 Regular astigmatism, bilateral: Secondary | ICD-10-CM | POA: Diagnosis not present

## 2023-06-16 LAB — CMP14+EGFR
ALT: 14 IU/L (ref 0–44)
AST: 24 IU/L (ref 0–40)
Albumin: 4.7 g/dL (ref 3.8–4.9)
Alkaline Phosphatase: 64 IU/L (ref 44–121)
BUN/Creatinine Ratio: 12 (ref 9–20)
BUN: 13 mg/dL (ref 6–24)
Bilirubin Total: 0.7 mg/dL (ref 0.0–1.2)
CO2: 22 mmol/L (ref 20–29)
Calcium: 9.5 mg/dL (ref 8.7–10.2)
Chloride: 106 mmol/L (ref 96–106)
Creatinine, Ser: 1.08 mg/dL (ref 0.76–1.27)
Globulin, Total: 2.5 g/dL (ref 1.5–4.5)
Glucose: 82 mg/dL (ref 70–99)
Potassium: 4.1 mmol/L (ref 3.5–5.2)
Sodium: 142 mmol/L (ref 134–144)
Total Protein: 7.2 g/dL (ref 6.0–8.5)
eGFR: 82 mL/min/{1.73_m2} (ref >59)

## 2023-06-16 LAB — CBC
Hematocrit: 45.1 % (ref 37.5–51.0)
Hemoglobin: 14.4 g/dL (ref 13.0–17.7)
MCH: 30.1 pg (ref 26.6–33.0)
MCHC: 31.9 g/dL (ref 31.5–35.7)
MCV: 94 fL (ref 79–97)
Platelets: 140 10*3/uL — ABNORMAL LOW (ref 150–450)
RBC: 4.78 x10E6/uL (ref 4.14–5.80)
RDW: 12.2 % (ref 11.6–15.4)
WBC: 4.8 10*3/uL (ref 3.4–10.8)

## 2023-06-16 LAB — D-DIMER, QUANTITATIVE: D-DIMER: 0.25 mg/L FEU (ref 0.00–0.49)

## 2023-06-16 LAB — TSH: TSH: 2.44 u[IU]/mL (ref 0.450–4.500)

## 2023-06-16 NOTE — Addendum Note (Signed)
Addended by: Chalmers Cater on: 06/16/2023 11:15 AM   Modules accepted: Orders

## 2023-06-21 NOTE — Progress Notes (Signed)
His platelets have only been low for the last 2 checks.  They appear to be trending up so I would recommend rechecking a CBC in 4-6 weeks to evaluate for return to normal.  At this point, there is no indication to do imaging.  When we recheck the CBC, we can certainly add labs to check for autoimmune disease.  As for the weight loss, he is currently taking Mounjaro with a goal for weight loss and has noted an increase in activity and decrease in p.o. intake.  If dramatic weight loss continues, we will need to cut back on Mounjaro dosing to slow the process and prevent further concerns.

## 2023-06-28 ENCOUNTER — Ambulatory Visit: Payer: Commercial Managed Care - PPO | Admitting: Medical-Surgical

## 2023-06-28 ENCOUNTER — Other Ambulatory Visit: Payer: Self-pay | Admitting: Medical-Surgical

## 2023-06-28 ENCOUNTER — Other Ambulatory Visit (HOSPITAL_BASED_OUTPATIENT_CLINIC_OR_DEPARTMENT_OTHER): Payer: Self-pay

## 2023-06-28 ENCOUNTER — Encounter: Payer: Self-pay | Admitting: Medical-Surgical

## 2023-06-28 VITALS — BP 108/75 | HR 85 | Resp 20 | Ht 71.0 in | Wt 224.0 lb

## 2023-06-28 DIAGNOSIS — R41840 Attention and concentration deficit: Secondary | ICD-10-CM

## 2023-06-28 DIAGNOSIS — H04123 Dry eye syndrome of bilateral lacrimal glands: Secondary | ICD-10-CM

## 2023-06-28 DIAGNOSIS — B356 Tinea cruris: Secondary | ICD-10-CM

## 2023-06-28 DIAGNOSIS — F1320 Sedative, hypnotic or anxiolytic dependence, uncomplicated: Secondary | ICD-10-CM | POA: Diagnosis not present

## 2023-06-28 DIAGNOSIS — F419 Anxiety disorder, unspecified: Secondary | ICD-10-CM

## 2023-06-28 DIAGNOSIS — R079 Chest pain, unspecified: Secondary | ICD-10-CM | POA: Diagnosis not present

## 2023-06-28 DIAGNOSIS — Q078 Other specified congenital malformations of nervous system: Secondary | ICD-10-CM | POA: Diagnosis not present

## 2023-06-28 DIAGNOSIS — Z903 Acquired absence of stomach [part of]: Secondary | ICD-10-CM

## 2023-06-28 DIAGNOSIS — R490 Dysphonia: Secondary | ICD-10-CM | POA: Diagnosis not present

## 2023-06-28 MED ORDER — LISDEXAMFETAMINE DIMESYLATE 40 MG PO CAPS
40.0000 mg | ORAL_CAPSULE | ORAL | 0 refills | Status: DC
  Filled 2023-06-28 – 2023-06-30 (×2): qty 30, 30d supply, fill #0

## 2023-06-28 MED ORDER — CLONAZEPAM 1 MG PO TABS
1.0000 mg | ORAL_TABLET | Freq: Two times a day (BID) | ORAL | 5 refills | Status: DC | PRN
  Filled 2023-06-28 – 2023-06-30 (×2): qty 60, 30d supply, fill #0
  Filled 2023-07-20 – 2023-07-29 (×3): qty 60, 30d supply, fill #1
  Filled 2023-08-29: qty 60, 30d supply, fill #2
  Filled 2023-09-25: qty 60, 30d supply, fill #3
  Filled 2023-10-24: qty 60, 30d supply, fill #4
  Filled 2023-12-03: qty 60, 30d supply, fill #5

## 2023-06-28 MED ORDER — CLOTRIMAZOLE-BETAMETHASONE 1-0.05 % EX CREA
1.0000 | TOPICAL_CREAM | Freq: Every day | CUTANEOUS | 0 refills | Status: DC
Start: 2023-06-28 — End: 2023-07-20
  Filled 2023-06-28: qty 30, 30d supply, fill #0

## 2023-06-28 MED ORDER — QUVIVIQ 50 MG PO TABS
50.0000 mg | ORAL_TABLET | Freq: Every evening | ORAL | 5 refills | Status: DC
Start: 2023-06-28 — End: 2023-12-28
  Filled 2023-06-28 – 2023-06-30 (×2): qty 30, 30d supply, fill #0
  Filled 2023-07-20 – 2023-07-29 (×3): qty 30, 30d supply, fill #1
  Filled 2023-08-29: qty 30, 30d supply, fill #2
  Filled 2023-09-25: qty 30, 30d supply, fill #3
  Filled 2023-10-24: qty 30, 30d supply, fill #4
  Filled 2023-12-03: qty 30, 30d supply, fill #5

## 2023-06-28 NOTE — Progress Notes (Signed)
Established patient visit  History, exam, impression, and plan:  1. Congenital anomaly, optic nerve, left (HCC) 2. Bilateral dry eyes Pleasant 52 year old male presenting today with a history of congenital eye anomalies as well as bilateral dry eye.  He is followed by ophthalmology and is up-to-date on his visits.  Recently had a change in his contact prescription but is overall doing well.  3. Benzodiazepine dependence (HCC) History of benzodiazepine dependence on Klonopin however after working to address ADHD symptoms and getting started on Cymbalta, he reports that he is using this only on an as needed basis to treat panic attack symptoms rather than preemptively throughout the day.  Notes that the medication still helps but he is cognizant of limiting use as much as possible.  4. Morbid obesity (HCC) Previous issues with morbid obesity however he has lost 63 pounds in the last year with the use of Mounjaro.  He is currently on Mounjaro 7.5 mg weekly but has 10 mg weekly at home and plans to go up to that starting next week.  He does have a history of gastric sleeve however there were complications with this and part of the gastric sleeve and remaining stomach is in the lower chest and extends down into the abdomen.  Discussed rapid weight loss recently.  If rapid weight loss continues, we will need to dial back on use of Mounjaro.    5. Anxiety Was able to get started on Cymbalta 30 mg nightly and has been doing well on this overall.  Notes that the intrusive thoughts and negative thought processes have resolved.  He is still finding himself going down the rabbit hole with research but not anywhere near what he was doing before.  Overall likes the effect of the medication and is interested in possibly increasing the dose.  Discussed the maximum effectiveness timeline of these medications.  He would like to try is taking 60 mg nightly and will let me know if this is working well via  [employer].  If so, I will be glad to refill the Cymbalta for 60 mg nightly.  6. Attention or concentration deficit Doing well on Vyvanse now that we have reduced the dose.  Feels the medication works well and has no current concerns.  7. H/O gastric sleeve Overall has done well with gastric sleeve however with the addition of Mounjaro, he is rapidly losing weight.  Notes an issue with decreased appetite and has been making healthier dietary choices in addition to staying physically active.  Overdue for check of labs for patients with a history of gastric surgery.  Lab orders entered today.  8. Hoarseness Has noticed sore throat and hoarseness surrounding the Mounjaro injections that gradually improved throughout the week however worsens after the next injection.  Thinks he has a very remote history of his grandmother having something wrong with her thyroid but is not sure if this was cancer or something different.  Prefer to err on the side of caution in this case especially with rapid weight loss.  Getting ultrasound of the thyroid for further evaluation.  9. Chest pain, unspecified type Has noticed some mid chest pain when in certain positions and is worried that this may be related to his gastric sleeve complications.  Unfortunately, difficulty with imaging in this situation.  For further investigation and evaluation of the chest pain, getting CT of the chest, abdomen, and pelvis.   Procedures performed this visit: None.  Return in  about 3 months (around 09/28/2023) for chronic disease follow up.  __________________________________ Thayer Ohm, DNP, APRN, FNP-BC Primary Care and Sports Medicine [facility]

## 2023-06-30 ENCOUNTER — Other Ambulatory Visit (HOSPITAL_BASED_OUTPATIENT_CLINIC_OR_DEPARTMENT_OTHER): Payer: Self-pay

## 2023-07-01 LAB — COPPER, SERUM: Copper: 97 ug/dL (ref 69–132)

## 2023-07-01 LAB — ZINC: Zinc: 69 ug/dL (ref 44–115)

## 2023-07-01 LAB — VITAMIN B1: Thiamine: 72.9 nmol/L (ref 66.5–200.0)

## 2023-07-01 LAB — CBC
Hematocrit: 47.6 % (ref 37.5–51.0)
Hemoglobin: 15.9 g/dL (ref 13.0–17.7)
MCH: 31.2 pg (ref 26.6–33.0)
MCHC: 33.4 g/dL (ref 31.5–35.7)
MCV: 94 fL (ref 79–97)
Platelets: 157 10*3/uL (ref 150–450)
RBC: 5.09 x10E6/uL (ref 4.14–5.80)
RDW: 11.9 % (ref 11.6–15.4)
WBC: 4.9 10*3/uL (ref 3.4–10.8)

## 2023-07-01 LAB — VITAMIN B12: Vitamin B-12: 288 pg/mL (ref 232–1245)

## 2023-07-01 LAB — FOLATE: Folate: 3.7 ng/mL (ref >3.0)

## 2023-07-01 LAB — VITAMIN D 25 HYDROXY (VIT D DEFICIENCY, FRACTURES): Vit D, 25-Hydroxy: 45.1 ng/mL (ref 30.0–100.0)

## 2023-07-01 LAB — PARATHYROID HORMONE, INTACT (NO CA): PTH: 31 pg/mL (ref 15–65)

## 2023-07-04 ENCOUNTER — Ambulatory Visit: Payer: Commercial Managed Care - PPO

## 2023-07-04 ENCOUNTER — Ambulatory Visit (INDEPENDENT_AMBULATORY_CARE_PROVIDER_SITE_OTHER): Payer: Commercial Managed Care - PPO

## 2023-07-04 DIAGNOSIS — R079 Chest pain, unspecified: Secondary | ICD-10-CM

## 2023-07-04 DIAGNOSIS — R490 Dysphonia: Secondary | ICD-10-CM | POA: Diagnosis not present

## 2023-07-04 DIAGNOSIS — Z903 Acquired absence of stomach [part of]: Secondary | ICD-10-CM | POA: Diagnosis not present

## 2023-07-04 DIAGNOSIS — K449 Diaphragmatic hernia without obstruction or gangrene: Secondary | ICD-10-CM | POA: Diagnosis not present

## 2023-07-04 DIAGNOSIS — J984 Other disorders of lung: Secondary | ICD-10-CM | POA: Diagnosis not present

## 2023-07-04 DIAGNOSIS — K5641 Fecal impaction: Secondary | ICD-10-CM | POA: Diagnosis not present

## 2023-07-04 DIAGNOSIS — J029 Acute pharyngitis, unspecified: Secondary | ICD-10-CM | POA: Diagnosis not present

## 2023-07-04 DIAGNOSIS — K4090 Unilateral inguinal hernia, without obstruction or gangrene, not specified as recurrent: Secondary | ICD-10-CM | POA: Diagnosis not present

## 2023-07-07 ENCOUNTER — Other Ambulatory Visit: Payer: Self-pay | Admitting: Medical-Surgical

## 2023-07-20 ENCOUNTER — Other Ambulatory Visit (HOSPITAL_BASED_OUTPATIENT_CLINIC_OR_DEPARTMENT_OTHER): Payer: Self-pay

## 2023-07-20 ENCOUNTER — Encounter: Payer: Self-pay | Admitting: Medical-Surgical

## 2023-07-20 ENCOUNTER — Other Ambulatory Visit: Payer: Self-pay | Admitting: Medical-Surgical

## 2023-07-20 DIAGNOSIS — B356 Tinea cruris: Secondary | ICD-10-CM

## 2023-07-20 DIAGNOSIS — K4090 Unilateral inguinal hernia, without obstruction or gangrene, not specified as recurrent: Secondary | ICD-10-CM

## 2023-07-20 MED ORDER — CLOTRIMAZOLE-BETAMETHASONE 1-0.05 % EX CREA
1.0000 | TOPICAL_CREAM | Freq: Every day | CUTANEOUS | 0 refills | Status: DC
  Filled 2023-07-20: qty 30, 30d supply, fill #0
  Filled ????-??-??: fill #0

## 2023-07-20 MED ORDER — LISDEXAMFETAMINE DIMESYLATE 40 MG PO CAPS
40.0000 mg | ORAL_CAPSULE | ORAL | 0 refills | Status: DC
  Filled 2023-08-01: qty 30, 30d supply, fill #0

## 2023-07-20 NOTE — Telephone Encounter (Signed)
I can't refuse this request. Last filled 06/28/2023

## 2023-07-21 ENCOUNTER — Other Ambulatory Visit: Payer: Self-pay | Admitting: Medical-Surgical

## 2023-07-21 ENCOUNTER — Other Ambulatory Visit (HOSPITAL_BASED_OUTPATIENT_CLINIC_OR_DEPARTMENT_OTHER): Payer: Self-pay

## 2023-07-21 DIAGNOSIS — B356 Tinea cruris: Secondary | ICD-10-CM

## 2023-07-21 MED ORDER — CLOTRIMAZOLE-BETAMETHASONE 1-0.05 % EX CREA
1.0000 | TOPICAL_CREAM | Freq: Every day | CUTANEOUS | 1 refills | Status: AC
  Filled 2023-07-21 – 2023-07-22 (×2): qty 90, 90d supply, fill #0
  Filled 2023-12-03: qty 90, 90d supply, fill #1

## 2023-07-21 NOTE — Telephone Encounter (Signed)
Lotrisone cream shows sent yesterday - is this the medication he is requesting?

## 2023-07-22 ENCOUNTER — Other Ambulatory Visit (HOSPITAL_BASED_OUTPATIENT_CLINIC_OR_DEPARTMENT_OTHER): Payer: Self-pay

## 2023-07-26 ENCOUNTER — Other Ambulatory Visit (HOSPITAL_BASED_OUTPATIENT_CLINIC_OR_DEPARTMENT_OTHER): Payer: Self-pay

## 2023-07-26 ENCOUNTER — Other Ambulatory Visit: Payer: Self-pay | Admitting: Medical-Surgical

## 2023-07-26 MED ORDER — DULOXETINE HCL 60 MG PO CPEP
60.0000 mg | ORAL_CAPSULE | Freq: Every day | ORAL | 3 refills | Status: DC
  Filled 2023-07-26: qty 90, 90d supply, fill #0
  Filled 2023-09-25 – 2023-10-19 (×2): qty 90, 90d supply, fill #1
  Filled 2024-01-27: qty 90, 90d supply, fill #2
  Filled 2024-05-01: qty 90, 90d supply, fill #3

## 2023-07-26 MED ORDER — BUPROPION HCL ER (XL) 150 MG PO TB24
150.0000 mg | ORAL_TABLET | Freq: Every day | ORAL | 0 refills | Status: DC
  Filled 2023-07-26: qty 90, 90d supply, fill #0

## 2023-07-27 ENCOUNTER — Encounter (HOSPITAL_BASED_OUTPATIENT_CLINIC_OR_DEPARTMENT_OTHER): Payer: Self-pay

## 2023-07-27 ENCOUNTER — Other Ambulatory Visit: Payer: Self-pay

## 2023-07-27 ENCOUNTER — Other Ambulatory Visit (HOSPITAL_BASED_OUTPATIENT_CLINIC_OR_DEPARTMENT_OTHER): Payer: Self-pay

## 2023-07-27 MED ORDER — ONDANSETRON 8 MG PO TBDP
8.0000 mg | ORAL_TABLET | Freq: Three times a day (TID) | ORAL | 3 refills | Status: DC | PRN
  Filled 2023-07-27: qty 20, 7d supply, fill #0
  Filled 2023-08-29: qty 20, 7d supply, fill #1
  Filled 2023-09-25: qty 20, 7d supply, fill #2
  Filled 2023-10-24: qty 20, 7d supply, fill #3

## 2023-07-28 ENCOUNTER — Other Ambulatory Visit (HOSPITAL_BASED_OUTPATIENT_CLINIC_OR_DEPARTMENT_OTHER): Payer: Self-pay

## 2023-08-01 ENCOUNTER — Other Ambulatory Visit (HOSPITAL_BASED_OUTPATIENT_CLINIC_OR_DEPARTMENT_OTHER): Payer: Self-pay

## 2023-08-01 ENCOUNTER — Other Ambulatory Visit: Payer: Self-pay

## 2023-08-18 DIAGNOSIS — K4090 Unilateral inguinal hernia, without obstruction or gangrene, not specified as recurrent: Secondary | ICD-10-CM | POA: Diagnosis not present

## 2023-08-18 DIAGNOSIS — K449 Diaphragmatic hernia without obstruction or gangrene: Secondary | ICD-10-CM | POA: Diagnosis not present

## 2023-08-18 DIAGNOSIS — Z9884 Bariatric surgery status: Secondary | ICD-10-CM | POA: Diagnosis not present

## 2023-08-29 ENCOUNTER — Other Ambulatory Visit: Payer: Self-pay

## 2023-08-29 ENCOUNTER — Other Ambulatory Visit: Payer: Self-pay | Admitting: Medical-Surgical

## 2023-08-30 MED ORDER — LISDEXAMFETAMINE DIMESYLATE 40 MG PO CAPS
40.0000 mg | ORAL_CAPSULE | ORAL | 0 refills | Status: DC
  Filled 2023-08-30: qty 30, 30d supply, fill #0

## 2023-08-31 ENCOUNTER — Other Ambulatory Visit (HOSPITAL_BASED_OUTPATIENT_CLINIC_OR_DEPARTMENT_OTHER): Payer: Self-pay

## 2023-08-31 NOTE — Progress Notes (Signed)
Sent message, via epic in basket, requesting orders in epic from surgeon.  

## 2023-09-01 ENCOUNTER — Ambulatory Visit: Payer: Self-pay | Admitting: General Surgery

## 2023-09-01 NOTE — Patient Instructions (Signed)
DUE TO COVID-19 ONLY TWO VISITORS  (aged 54 and older)  ARE ALLOWED TO COME WITH YOU AND STAY IN THE WAITING ROOM ONLY DURING PRE OP AND PROCEDURE.   **NO VISITORS ARE ALLOWED IN THE SHORT STAY AREA OR RECOVERY ROOM!!**  IF YOU WILL BE ADMITTED INTO THE HOSPITAL YOU ARE ALLOWED ONLY FOUR SUPPORT PEOPLE DURING VISITATION HOURS ONLY (7 AM -8PM)   The support person(s) must pass our screening, gel in and out, and wear a mask at all times, including in the patient's room. Patients must also wear a mask when staff or their support person are in the room. Visitors GUEST BADGE MUST BE WORN VISIBLY  One adult visitor may remain with you overnight and MUST be in the room by 8 P.M.     Your procedure is scheduled on: 09/12/22   Report to [facility] Main Entrance    Report to admitting at : 7:45 AM   Call this number if you have problems the morning of surgery [phone]   Eat a light diet the day before surgery.  Examples including soups, broths, toast, yogurt, mashed potatoes.  Things to avoid include carbonated beverages (fizzy beverages), raw fruits and raw vegetables, or beans.   If your bowels are filled with gas, your surgeon will have difficulty visualizing your pelvic organs which increases your surgical risks.  Do not eat food :After Midnight.   After Midnight you may have the following liquids until : 7:00 AM DAY OF SURGERY  Water Black Coffee (sugar ok, NO MILK/CREAM OR CREAMERS)  Tea (sugar ok, NO MILK/CREAM OR CREAMERS) regular and decaf                             Plain Jell-O (NO RED)                                           Fruit ices (not with fruit pulp, NO RED)                                     Popsicles (NO RED)                                                                  Juice: apple, WHITE grape, WHITE cranberry Sports drinks like Gatorade (NO RED)              FOLLOW ANY ADDITIONAL PRE OP INSTRUCTIONS YOU RECEIVED FROM YOUR SURGEON'S OFFICE!!!    Oral Hygiene is also important to reduce your risk of infection.                                    Remember - BRUSH YOUR TEETH THE MORNING OF SURGERY WITH YOUR REGULAR TOOTHPASTE  DENTURES WILL BE REMOVED PRIOR TO SURGERY PLEASE DO NOT APPLY "Poly grip" OR ADHESIVES!!!   Do NOT smoke after Midnight   Take these medicines the morning of surgery with A SIP OF  WATER: clonazepam,bupropion.                     You may not have any metal on your body including hair pins, jewelry, and body piercing             Do not wear lotions, powders, perfumes/cologne, or deodorant              Men may shave face and neck.   Do not bring valuables to the hospital. [facility] IS NOT             RESPONSIBLE   FOR VALUABLES.   Contacts, glasses, or bridgework may not be worn into surgery.   Bring small overnight bag day of surgery.   DO NOT BRING YOUR HOME MEDICATIONS TO THE HOSPITAL. PHARMACY WILL DISPENSE MEDICATIONS LISTED ON YOUR MEDICATION LIST TO YOU DURING YOUR ADMISSION IN THE HOSPITAL!    Patients discharged on the day of surgery will not be allowed to drive home.  Someone NEEDS to stay with you for the first 24 hours after anesthesia.   Special Instructions: Bring a copy of your healthcare power of attorney and living will documents         the day of surgery if you haven't scanned them before.              Please read over the following fact sheets you were given: IF YOU HAVE QUESTIONS ABOUT YOUR PRE-OP INSTRUCTIONS PLEASE CALL [phone]    [facility] - Preparing for Surgery Before surgery, you can play an important role.  Because skin is not sterile, your skin needs to be as free of germs as possible.  You can reduce the number of germs on your skin by washing with CHG (chlorahexidine gluconate) soap before surgery.  CHG is an antiseptic cleaner which kills germs and bonds with the skin to continue killing germs even after washing. Please DO NOT use if you have an allergy to CHG or  antibacterial soaps.  If your skin becomes reddened/irritated stop using the CHG and inform your nurse when you arrive at Short Stay. Do not shave (including legs and underarms) for at least 48 hours prior to the first CHG shower.  You may shave your face/neck. Please follow these instructions carefully:  1.  Shower with CHG Soap the night before surgery and the  morning of Surgery.  2.  If you choose to wash your hair, wash your hair first as usual with your  normal  shampoo.  3.  After you shampoo, rinse your hair and body thoroughly to remove the  shampoo.                           4.  Use CHG as you would any other liquid soap.  You can apply chg directly  to the skin and wash                       Gently with a scrungie or clean washcloth.  5.  Apply the CHG Soap to your body ONLY FROM THE NECK DOWN.   Do not use on face/ open                           Wound or open sores. Avoid contact with eyes, ears mouth and genitals (private parts).  Wash face,  Genitals (private parts) with your normal soap.             6.  Wash thoroughly, paying special attention to the area where your surgery  will be performed.  7.  Thoroughly rinse your body with warm water from the neck down.  8.  DO NOT shower/wash with your normal soap after using and rinsing off  the CHG Soap.                9.  Pat yourself dry with a clean towel.            10.  Wear clean pajamas.            11.  Place clean sheets on your bed the night of your first shower and do not  sleep with pets. Day of Surgery : Do not apply any lotions/deodorants the morning of surgery.  Please wear clean clothes to the hospital/surgery center.  FAILURE TO FOLLOW THESE INSTRUCTIONS MAY RESULT IN THE CANCELLATION OF YOUR SURGERY PATIENT SIGNATURE_________________________________  NURSE SIGNATURE__________________________________  ________________________________________________________________________

## 2023-09-02 ENCOUNTER — Encounter (HOSPITAL_COMMUNITY)
Admission: RE | Admit: 2023-09-02 | Discharge: 2023-09-02 | Disposition: A | Discharge status: Home / Self Care | Payer: Commercial Managed Care - PPO | Source: Ambulatory Visit | Attending: General Surgery

## 2023-09-02 ENCOUNTER — Other Ambulatory Visit: Payer: Self-pay

## 2023-09-02 ENCOUNTER — Encounter (HOSPITAL_COMMUNITY): Payer: Self-pay

## 2023-09-02 VITALS — BP 119/99 | HR 95 | Temp 97.8°F | Ht 71.0 in | Wt 212.0 lb

## 2023-09-02 DIAGNOSIS — Z01818 Encounter for other preprocedural examination: Secondary | ICD-10-CM

## 2023-09-02 DIAGNOSIS — Z01812 Encounter for preprocedural laboratory examination: Secondary | ICD-10-CM | POA: Insufficient documentation

## 2023-09-02 LAB — CBC
HCT: 45.3 % (ref 39.0–52.0)
Hemoglobin: 15.1 g/dL (ref 13.0–17.0)
MCH: 30.6 pg (ref 26.0–34.0)
MCHC: 33.3 g/dL (ref 30.0–36.0)
MCV: 91.7 fL (ref 80.0–100.0)
Platelets: 154 10*3/uL (ref 150–400)
RBC: 4.94 MIL/uL (ref 4.22–5.81)
RDW: 12.8 % (ref 11.5–15.5)
WBC: 6.1 10*3/uL (ref 4.0–10.5)
nRBC: 0 % (ref 0.0–0.2)

## 2023-09-02 NOTE — Progress Notes (Addendum)
For Anesthesia: PCP - Christen Butter, NP  Cardiologist - N/A  Bowel Prep reminder:  Chest x-ray -  EKG -  Stress Test -  ECHO -  Cardiac Cath -  Pacemaker/ICD device last checked: Pacemaker orders received: Device Rep notified:  Spinal Cord Stimulator:N/A  Sleep Study - Yes CPAP - NO  Fasting Blood Sugar - N/A Checks Blood Sugar _____ times a day Date and result of last Hgb A1c-  Last dose of GLP1 agonist- Mounjaro GLP1 instructions: To hold after: 09/05/23  Last dose of SGLT-2 inhibitors- N/A SGLT-2 instructions:   Blood Thinner Instructions:N/A Aspirin Instructions: Last Dose:  Activity level: Can go up a flight of stairs and activities of daily living without stopping and without chest pain and/or shortness of breath   Able to exercise without chest pain and/or shortness of breath  Anesthesia review:   Patient denies shortness of breath, fever, cough and chest pain at PAT appointment   Patient verbalized understanding of instructions that were given to them at the PAT appointment. Patient was also instructed that they will need to review over the PAT instructions again at home before surgery.

## 2023-09-13 ENCOUNTER — Ambulatory Visit (HOSPITAL_COMMUNITY): Payer: Commercial Managed Care - PPO | Admitting: Anesthesiology

## 2023-09-13 ENCOUNTER — Other Ambulatory Visit: Payer: Self-pay

## 2023-09-13 ENCOUNTER — Other Ambulatory Visit (HOSPITAL_COMMUNITY): Payer: Self-pay

## 2023-09-13 ENCOUNTER — Encounter (HOSPITAL_COMMUNITY): Payer: Self-pay | Admitting: General Surgery

## 2023-09-13 ENCOUNTER — Ambulatory Visit (HOSPITAL_BASED_OUTPATIENT_CLINIC_OR_DEPARTMENT_OTHER): Payer: Commercial Managed Care - PPO | Admitting: Anesthesiology

## 2023-09-13 ENCOUNTER — Encounter (HOSPITAL_COMMUNITY)
Admission: RE | Disposition: A | Discharge status: Home / Self Care | Payer: Self-pay | Source: Home / Self Care | Attending: General Surgery

## 2023-09-13 ENCOUNTER — Ambulatory Visit (HOSPITAL_COMMUNITY)
Admission: RE | Admit: 2023-09-13 | Discharge: 2023-09-13 | Disposition: A | Discharge status: Home / Self Care | Payer: Commercial Managed Care - PPO | Attending: General Surgery | Admitting: General Surgery

## 2023-09-13 DIAGNOSIS — Z87891 Personal history of nicotine dependence: Secondary | ICD-10-CM | POA: Insufficient documentation

## 2023-09-13 DIAGNOSIS — L987 Excessive and redundant skin and subcutaneous tissue: Secondary | ICD-10-CM | POA: Diagnosis not present

## 2023-09-13 DIAGNOSIS — F419 Anxiety disorder, unspecified: Secondary | ICD-10-CM | POA: Diagnosis not present

## 2023-09-13 DIAGNOSIS — K4090 Unilateral inguinal hernia, without obstruction or gangrene, not specified as recurrent: Secondary | ICD-10-CM | POA: Diagnosis not present

## 2023-09-13 DIAGNOSIS — K449 Diaphragmatic hernia without obstruction or gangrene: Secondary | ICD-10-CM | POA: Diagnosis not present

## 2023-09-13 DIAGNOSIS — G4730 Sleep apnea, unspecified: Secondary | ICD-10-CM | POA: Insufficient documentation

## 2023-09-13 DIAGNOSIS — F418 Other specified anxiety disorders: Secondary | ICD-10-CM | POA: Diagnosis not present

## 2023-09-13 DIAGNOSIS — M1990 Unspecified osteoarthritis, unspecified site: Secondary | ICD-10-CM | POA: Diagnosis not present

## 2023-09-13 DIAGNOSIS — D176 Benign lipomatous neoplasm of spermatic cord: Secondary | ICD-10-CM | POA: Insufficient documentation

## 2023-09-13 DIAGNOSIS — Z9884 Bariatric surgery status: Secondary | ICD-10-CM | POA: Insufficient documentation

## 2023-09-13 DIAGNOSIS — F1320 Sedative, hypnotic or anxiolytic dependence, uncomplicated: Secondary | ICD-10-CM | POA: Insufficient documentation

## 2023-09-13 DIAGNOSIS — J45909 Unspecified asthma, uncomplicated: Secondary | ICD-10-CM | POA: Diagnosis not present

## 2023-09-13 HISTORY — PX: XI ROBOTIC ASSISTED INGUINAL HERNIA REPAIR WITH MESH: SHX6706

## 2023-09-13 SURGERY — REPAIR, HERNIA, INGUINAL, ROBOT-ASSISTED, LAPAROSCOPIC, USING MESH
Anesthesia: General | Laterality: Right

## 2023-09-13 MED ORDER — SUGAMMADEX SODIUM 200 MG/2ML IV SOLN
INTRAVENOUS | Status: DC | PRN
  Administered 2023-09-13: 200 mg via INTRAVENOUS
  Administered 2023-09-13: 100 mg via INTRAVENOUS

## 2023-09-13 MED ORDER — KETOROLAC TROMETHAMINE 15 MG/ML IJ SOLN
INTRAMUSCULAR | Status: DC | PRN
  Administered 2023-09-13: 15 mg via INTRAVENOUS

## 2023-09-13 MED ORDER — ONDANSETRON HCL 4 MG/2ML IJ SOLN
INTRAMUSCULAR | Status: AC
  Filled 2023-09-13: qty 2

## 2023-09-13 MED ORDER — HYDROMORPHONE HCL 1 MG/ML IJ SOLN
0.2500 mg | INTRAMUSCULAR | Status: DC | PRN
  Administered 2023-09-13: 0.5 mg via INTRAVENOUS

## 2023-09-13 MED ORDER — HYDROMORPHONE HCL 2 MG PO TABS: 2.0000 mg | ORAL_TABLET | Freq: Once | ORAL | Status: DC | PRN

## 2023-09-13 MED ORDER — ONDANSETRON HCL 4 MG/2ML IJ SOLN
INTRAMUSCULAR | Status: DC | PRN
  Administered 2023-09-13: 4 mg via INTRAVENOUS

## 2023-09-13 MED ORDER — LIDOCAINE HCL (CARDIAC) PF 100 MG/5ML IV SOSY
PREFILLED_SYRINGE | INTRAVENOUS | Status: DC | PRN
  Administered 2023-09-13: 80 mg via INTRAVENOUS

## 2023-09-13 MED ORDER — PHENYLEPHRINE HCL (PRESSORS) 10 MG/ML IV SOLN
INTRAVENOUS | Status: DC | PRN
  Administered 2023-09-13 (×2): 80 ug via INTRAVENOUS
  Administered 2023-09-13: 100 ug via INTRAVENOUS

## 2023-09-13 MED ORDER — BUPIVACAINE LIPOSOME 1.3 % IJ SUSP
INTRAMUSCULAR | Status: DC | PRN
  Administered 2023-09-13: 20 mL

## 2023-09-13 MED ORDER — PROPOFOL 10 MG/ML IV BOLUS
INTRAVENOUS | Status: DC | PRN
  Administered 2023-09-13 (×2): 20 mg via INTRAVENOUS
  Administered 2023-09-13: 200 mg via INTRAVENOUS
  Administered 2023-09-13 (×2): 20 mg via INTRAVENOUS
  Administered 2023-09-13: 50 ug/kg/min via INTRAVENOUS
  Administered 2023-09-13: 20 mg via INTRAVENOUS

## 2023-09-13 MED ORDER — ACETAMINOPHEN 500 MG PO TABS: 1000.0000 mg | ORAL_TABLET | Freq: Three times a day (TID) | ORAL | Status: AC

## 2023-09-13 MED ORDER — CHLORHEXIDINE GLUCONATE CLOTH 2 % EX PADS: 6.0000 | MEDICATED_PAD | Freq: Once | CUTANEOUS | Status: DC

## 2023-09-13 MED ORDER — BUPIVACAINE-EPINEPHRINE 0.25% -1:200000 IJ SOLN
INTRAMUSCULAR | Status: AC
  Filled 2023-09-13: qty 1

## 2023-09-13 MED ORDER — DEXMEDETOMIDINE HCL IN NACL 80 MCG/20ML IV SOLN
INTRAVENOUS | Status: DC | PRN
  Administered 2023-09-13: 12 ug via INTRAVENOUS
  Administered 2023-09-13: 8 ug via INTRAVENOUS

## 2023-09-13 MED ORDER — KETAMINE HCL 50 MG/5ML IJ SOSY
PREFILLED_SYRINGE | INTRAMUSCULAR | Status: AC
  Filled 2023-09-13: qty 5

## 2023-09-13 MED ORDER — FENTANYL CITRATE PF 50 MCG/ML IJ SOSY
PREFILLED_SYRINGE | INTRAMUSCULAR | Status: AC
  Administered 2023-09-13: 50 ug via INTRAVENOUS
  Filled 2023-09-13: qty 3

## 2023-09-13 MED ORDER — HYDROMORPHONE HCL 2 MG PO TABS
ORAL_TABLET | ORAL | Status: AC
  Filled 2023-09-13: qty 1

## 2023-09-13 MED ORDER — ROCURONIUM BROMIDE 10 MG/ML (PF) SYRINGE
PREFILLED_SYRINGE | INTRAVENOUS | Status: AC
  Filled 2023-09-13: qty 10

## 2023-09-13 MED ORDER — HYDROMORPHONE HCL 2 MG PO TABS
2.0000 mg | ORAL_TABLET | Freq: Four times a day (QID) | ORAL | 0 refills | Status: AC | PRN
  Filled 2023-09-13: qty 15, 4d supply, fill #0

## 2023-09-13 MED ORDER — DEXAMETHASONE SODIUM PHOSPHATE 10 MG/ML IJ SOLN
INTRAMUSCULAR | Status: AC
  Filled 2023-09-13: qty 1

## 2023-09-13 MED ORDER — FENTANYL CITRATE (PF) 100 MCG/2ML IJ SOLN
INTRAMUSCULAR | Status: AC
  Filled 2023-09-13: qty 2

## 2023-09-13 MED ORDER — FENTANYL CITRATE PF 50 MCG/ML IJ SOSY
25.0000 ug | PREFILLED_SYRINGE | INTRAMUSCULAR | Status: DC | PRN
  Administered 2023-09-13 (×2): 50 ug via INTRAVENOUS

## 2023-09-13 MED ORDER — DEXAMETHASONE SODIUM PHOSPHATE 10 MG/ML IJ SOLN
INTRAMUSCULAR | Status: DC | PRN
  Administered 2023-09-13: 10 mg via INTRAVENOUS

## 2023-09-13 MED ORDER — LIDOCAINE HCL 1 % IJ SOLN
INTRAMUSCULAR | Status: AC
  Filled 2023-09-13: qty 20

## 2023-09-13 MED ORDER — ROCURONIUM BROMIDE 100 MG/10ML IV SOLN
INTRAVENOUS | Status: DC | PRN
  Administered 2023-09-13 (×2): 10 mg via INTRAVENOUS
  Administered 2023-09-13: 50 mg via INTRAVENOUS

## 2023-09-13 MED ORDER — ACETAMINOPHEN 500 MG PO TABS
1000.0000 mg | ORAL_TABLET | ORAL | Status: AC
  Administered 2023-09-13: 1000 mg via ORAL
  Filled 2023-09-13: qty 2

## 2023-09-13 MED ORDER — CHLORHEXIDINE GLUCONATE 0.12 % MT SOLN
15.0000 mL | Freq: Once | OROMUCOSAL | Status: AC
  Administered 2023-09-13: 15 mL via OROMUCOSAL

## 2023-09-13 MED ORDER — BUPIVACAINE LIPOSOME 1.3 % IJ SUSP
INTRAMUSCULAR | Status: AC
  Filled 2023-09-13: qty 20

## 2023-09-13 MED ORDER — ORAL CARE MOUTH RINSE: 15.0000 mL | Freq: Once | OROMUCOSAL | Status: AC

## 2023-09-13 MED ORDER — MIDAZOLAM HCL 5 MG/5ML IJ SOLN
INTRAMUSCULAR | Status: DC | PRN
  Administered 2023-09-13: 2 mg via INTRAVENOUS

## 2023-09-13 MED ORDER — MIDAZOLAM HCL 2 MG/2ML IJ SOLN
INTRAMUSCULAR | Status: AC
  Filled 2023-09-13: qty 2

## 2023-09-13 MED ORDER — 0.9 % SODIUM CHLORIDE (POUR BTL) OPTIME
TOPICAL | Status: DC | PRN
  Administered 2023-09-13: 1000 mL

## 2023-09-13 MED ORDER — HYDROMORPHONE HCL 1 MG/ML IJ SOLN
INTRAMUSCULAR | Status: AC
  Administered 2023-09-13: 0.5 mg via INTRAVENOUS
  Filled 2023-09-13: qty 1

## 2023-09-13 MED ORDER — BUPIVACAINE-EPINEPHRINE (PF) 0.25% -1:200000 IJ SOLN
INTRAMUSCULAR | Status: DC | PRN
  Administered 2023-09-13: 30 mL via PERINEURAL

## 2023-09-13 MED ORDER — CIPROFLOXACIN IN D5W 400 MG/200ML IV SOLN
400.0000 mg | INTRAVENOUS | Status: AC
  Administered 2023-09-13: 400 mg via INTRAVENOUS
  Filled 2023-09-13: qty 200

## 2023-09-13 MED ORDER — FENTANYL CITRATE (PF) 100 MCG/2ML IJ SOLN
INTRAMUSCULAR | Status: DC | PRN
  Administered 2023-09-13 (×2): 50 ug via INTRAVENOUS

## 2023-09-13 MED ORDER — LACTATED RINGERS IV SOLN: INTRAVENOUS | Status: DC

## 2023-09-13 SURGICAL SUPPLY — 54 items
ANTIFOG SOL W/FOAM PAD STRL (MISCELLANEOUS) ×1 IMPLANT
BAG COUNTER SPONGE SURGICOUNT (BAG) IMPLANT
BLADE SURG SZ11 CARB STEEL (BLADE) ×1 IMPLANT
CANNULA REDUCER 12-8 DVNC XI (CANNULA) IMPLANT
CHLORAPREP W/TINT 26 (MISCELLANEOUS) ×1 IMPLANT
COVER MAYO STAND STRL (DRAPES) ×1 IMPLANT
COVER SURGICAL LIGHT HANDLE (MISCELLANEOUS) ×1 IMPLANT
COVER TIP SHEARS 8 DVNC (MISCELLANEOUS) ×1 IMPLANT
DERMABOND ADVANCED .7 DNX6 (GAUZE/BANDAGES/DRESSINGS) IMPLANT
DRAPE ARM DVNC X/XI (DISPOSABLE) ×3 IMPLANT
DRAPE COLUMN DVNC XI (DISPOSABLE) ×1 IMPLANT
DRIVER NDL LRG 8 DVNC XI (INSTRUMENTS) ×1 IMPLANT
DRIVER NDL MEGA SUTCUT DVNCXI (INSTRUMENTS) ×2 IMPLANT
DRIVER NDLE LRG 8 DVNC XI (INSTRUMENTS) ×1 IMPLANT
DRIVER NDLE MEGA SUTCUT DVNCXI (INSTRUMENTS) ×2 IMPLANT
DRSG TEGADERM 2-3/8X2-3/4 SM (GAUZE/BANDAGES/DRESSINGS) ×3 IMPLANT
ELECT REM PT RETURN 15FT ADLT (MISCELLANEOUS) ×1 IMPLANT
GAUZE 4X4 16PLY ~~LOC~~+RFID DBL (SPONGE) ×1 IMPLANT
GAUZE SPONGE 2X2 8PLY STRL LF (GAUZE/BANDAGES/DRESSINGS) ×1 IMPLANT
GLOVE BIO SURGEON STRL SZ7.5 (GLOVE) ×2 IMPLANT
GLOVE INDICATOR 8.0 STRL GRN (GLOVE) ×2 IMPLANT
GOWN STRL REUS W/ TWL XL LVL3 (GOWN DISPOSABLE) ×2 IMPLANT
GRASPER SUT TROCAR 14GX15 (MISCELLANEOUS) IMPLANT
GRASPER TIP-UP FEN DVNC XI (INSTRUMENTS) ×1 IMPLANT
IRRIG SUCT STRYKERFLOW 2 WTIP (MISCELLANEOUS) IMPLANT
IRRIGATION SUCT STRKRFLW 2 WTP (MISCELLANEOUS) IMPLANT
KIT BASIN OR (CUSTOM PROCEDURE TRAY) ×1 IMPLANT
KIT TURNOVER KIT A (KITS) IMPLANT
MARKER SKIN DUAL TIP RULER LAB (MISCELLANEOUS) ×1 IMPLANT
MESH 3DMAX MID 4X6 RT LRG (Mesh General) IMPLANT
NDL HYPO 22X1.5 SAFETY MO (MISCELLANEOUS) ×1 IMPLANT
NEEDLE HYPO 22X1.5 SAFETY MO (MISCELLANEOUS) ×1 IMPLANT
OBTURATOR OPTICAL STND 8 DVNC (TROCAR) ×1 IMPLANT
OBTURATOR OPTICALSTD 8 DVNC (TROCAR) ×1 IMPLANT
PACK CARDIOVASCULAR III (CUSTOM PROCEDURE TRAY) ×1 IMPLANT
PAD POSITIONING PINK XL (MISCELLANEOUS) ×1 IMPLANT
SCISSORS LAP 5X35 DISP (ENDOMECHANICALS) IMPLANT
SCISSORS MNPLR CVD DVNC XI (INSTRUMENTS) ×1 IMPLANT
SEAL UNIV 5-12 XI (MISCELLANEOUS) ×3 IMPLANT
SOL ELECTROSURG ANTI STICK (MISCELLANEOUS) ×1 IMPLANT
SOLUTION ANTFG W/FOAM PAD STRL (MISCELLANEOUS) ×1 IMPLANT
SOLUTION ELECTROSURG ANTI STCK (MISCELLANEOUS) ×1 IMPLANT
SPIKE FLUID TRANSFER (MISCELLANEOUS) ×1 IMPLANT
STRIP CLOSURE SKIN 1/2X4 (GAUZE/BANDAGES/DRESSINGS) ×1 IMPLANT
SUT MNCRL AB 4-0 PS2 18 (SUTURE) ×1 IMPLANT
SUT VIC AB 2-0 SH 27X BRD (SUTURE) IMPLANT
SUT VIC AB 3-0 SH 27XBRD (SUTURE) ×2 IMPLANT
SUT VICRYL 0 UR6 27IN ABS (SUTURE) IMPLANT
SUT VLOC 180 3-0 9IN GS21 (SUTURE) ×1 IMPLANT
SYR 20ML LL LF (SYRINGE) ×1 IMPLANT
TAPE STRIPS DRAPE STRL (GAUZE/BANDAGES/DRESSINGS) ×1 IMPLANT
TOWEL OR 17X26 10 PK STRL BLUE (TOWEL DISPOSABLE) ×1 IMPLANT
TROCAR XCEL NON-BLD 5MMX100MML (ENDOMECHANICALS) ×1 IMPLANT
TUBING INSUFFLATION 10FT LAP (TUBING) ×1 IMPLANT

## 2023-09-13 NOTE — Anesthesia Preprocedure Evaluation (Addendum)
Anesthesia Evaluation  Patient identified by MRN, date of birth, ID band Patient awake    Reviewed: Allergy & Precautions, NPO status , Patient's Chart, lab work & pertinent test results  Airway Mallampati: II  TM Distance: >3 FB Neck ROM: Full    Dental no notable dental hx. (+) Teeth Intact, Dental Advisory Given   Pulmonary asthma , sleep apnea , former smoker   Pulmonary exam normal breath sounds clear to auscultation       Cardiovascular negative cardio ROS Normal cardiovascular exam Rhythm:Regular Rate:Normal     Neuro/Psych  PSYCHIATRIC DISORDERS Anxiety     negative neurological ROS     GI/Hepatic Neg liver ROS, hiatal hernia,GERD  ,,  Endo/Other  negative endocrine ROS    Renal/GU negative Renal ROS  negative genitourinary   Musculoskeletal  (+) Arthritis ,    Abdominal   Peds  (+) ATTENTION DEFICIT DISORDER WITHOUT HYPERACTIVITY Hematology negative hematology ROS (+)   Anesthesia Other Findings   Reproductive/Obstetrics                             Anesthesia Physical Anesthesia Plan  ASA: 3  Anesthesia Plan: General   Post-op Pain Management: Tylenol PO (pre-op)*   Induction: Intravenous  PONV Risk Score and Plan: 2 and Midazolam, Dexamethasone and Ondansetron  Airway Management Planned: Oral ETT  Additional Equipment:   Intra-op Plan:   Post-operative Plan: Extubation in OR  Informed Consent: I have reviewed the patients History and Physical, chart, labs and discussed the procedure including the risks, benefits and alternatives for the proposed anesthesia with the patient or authorized representative who has indicated his/her understanding and acceptance.     Dental advisory given  Plan Discussed with: CRNA  Anesthesia Plan Comments: (2 IVs)       Anesthesia Quick Evaluation

## 2023-09-13 NOTE — Transfer of Care (Signed)
Immediate Anesthesia Transfer of Care Note  Patient: Jared Tran  Procedure(s) Performed: XI ROBOTIC ASSISTED RIGHT INGUINAL HERNIA REPAIR WITH MESH (Right)  Patient Location: PACU  Anesthesia Type:General  Level of Consciousness: awake, alert , and drowsy  Airway & Oxygen Therapy: Patient Spontanous Breathing  Post-op Assessment: Report given to RN  Post vital signs: Reviewed and stable  Last Vitals:  Vitals Value Taken Time  BP 96/81 09/13/23 1147  Temp    Pulse 93 09/13/23 1153  Resp 17 09/13/23 1153  SpO2 98 % 09/13/23 1153  Vitals shown include unfiled device data.  Last Pain:  Vitals:   09/13/23 0805  TempSrc: Oral         Complications: No notable events documented.

## 2023-09-13 NOTE — Discharge Instructions (Signed)
LAPAROSCOPIC/ROBOTIC SURGERY: POST OP INSTRUCTIONS Always review your discharge instruction sheet given to you by the facility where your surgery was performed. IF YOU HAVE DISABILITY OR FAMILY LEAVE FORMS, YOU MUST BRING THEM TO THE OFFICE FOR PROCESSING.   DO NOT GIVE THEM TO YOUR DOCTOR.  PAIN CONTROL  First take acetaminophen (Tylenol) AND/or ibuprofen (Advil) to control your pain after surgery.  Follow directions on package.  Taking acetaminophen (Tylenol) and/or ibuprofen (Advil) regularly after surgery will help to control your pain and lower the amount of prescription pain medication you may need.  You should not take more than 3,000 mg (3 grams) of acetaminophen (Tylenol) in 24 hours.  You should not take ibuprofen (Advil), aleve, motrin, naprosyn or other NSAIDS if you have a history of stomach ulcers or chronic kidney disease.  A prescription for pain medication may be given to you upon discharge.  Take your pain medication as prescribed, if you still have uncontrolled pain after taking acetaminophen (Tylenol) or ibuprofen (Advil). Use ice packs to help control pain. If you need a refill on your pain medication, please contact your pharmacy.  They will contact our office to request authorization. Prescriptions will not be filled after 5pm or on week-ends.  HOME MEDICATIONS Take your usually prescribed medications unless otherwise directed.  DIET You should follow a light diet the first few days after arrival home.  Be sure to include lots of fluids daily. Avoid fatty, fried foods.   CONSTIPATION It is common to experience some constipation after surgery and if you are taking pain medication.  Increasing fluid intake and taking a stool softener (such as Colace) will usually help or prevent this problem from occurring.  A mild laxative (Milk of Magnesia or Miralax) should be taken according to package instructions if there are no bowel movements after 48 hours.  WOUND/INCISION  CARE Most patients will experience some swelling and bruising in the area of the incisions.  Ice packs will help.  Swelling and bruising can take several days to resolve.  Unless discharge instructions indicate otherwise, follow guidelines below  STERI-STRIPS - you may remove your outer bandages 48 hours after surgery, and you may shower at that time.  You have steri-strips (small skin tapes) in place directly over the incision.  These strips should be left on the skin for 7-10 days.   DERMABOND/SKIN GLUE - you may shower in 24 hours.  The glue will flake off over the next 2-3 weeks. Any sutures or staples will be removed at the office during your follow-up visit.  ACTIVITIES You may resume regular (light) daily activities beginning the next day--such as daily self-care, walking, climbing stairs--gradually increasing activities as tolerated.  You may have sexual intercourse when it is comfortable.  Refrain from any heavy lifting or straining until approved by your doctor. You may drive when you are no longer taking prescription pain medication, you can comfortably wear a seatbelt, and you can safely maneuver your car and apply brakes.  FOLLOW-UP You should see your doctor in the office for a follow-up appointment approximately 2-3 weeks after your surgery.  You should have been given your post-op/follow-up appointment when your surgery was scheduled.  If you did not receive a post-op/follow-up appointment, make sure that you call for this appointment within a day or two after you arrive home to insure a convenient appointment time.  OTHER INSTRUCTIONS Expect some lower abdominal swelling.  This will go down over the next few days.  We also injected  long-acting numbing medicine along both sides of your abdominal wall which can leave red little dots where the injections were placed.  WHEN TO CALL YOUR DOCTOR: Fever over 101.0 Inability to urinate Continued bleeding from incision. Increased pain,  redness, or drainage from the incision. Increasing abdominal pain  The clinic staff is available to answer your questions during regular business hours.  Please don't hesitate to call and ask to speak to one of the nurses for clinical concerns.  If you have a medical emergency, go to the nearest emergency room or call 911.  A surgeon from [facility] is always on call at the hospital. [address], [place], [place]  [address] ? [address] [address], [place], [place]   [address] [phone] ? [phone] ? FAX [phone] Web site: [email or web address]

## 2023-09-13 NOTE — Anesthesia Postprocedure Evaluation (Signed)
Anesthesia Post Note  Patient: Jared Tran  Procedure(s) Performed: XI ROBOTIC ASSISTED RIGHT INGUINAL HERNIA REPAIR WITH MESH (Right)     Patient location during evaluation: PACU Anesthesia Type: General Level of consciousness: awake and alert Pain management: pain level controlled Vital Signs Assessment: post-procedure vital signs reviewed and stable Respiratory status: spontaneous breathing, nonlabored ventilation, respiratory function stable and patient connected to nasal cannula oxygen Cardiovascular status: blood pressure returned to baseline and stable Postop Assessment: no apparent nausea or vomiting Anesthetic complications: no  No notable events documented.  Last Vitals:  Vitals:   09/13/23 1315 09/13/23 1330  BP: 103/71 102/68  Pulse: 84 78  Resp: 18 13  Temp:    SpO2: 100% 97%    Last Pain:  Vitals:   09/13/23 1330  TempSrc:   PainSc: 3                  Adessa Primiano L Saadiya Wilfong

## 2023-09-13 NOTE — H&P (Signed)
REFERRING PHYSICIAN: Christen Butter, NP  PROVIDER: Anjali Manzella Sherril Cong, MD  MRN: [id number] DOB: August 30, 1971 DATE OF ENCOUNTER: 08/18/2023  Subjective  Chief Complaint: New Consultation (HERNIA)   History of Present Illness: Jared Tran is a 52 y.o. male who is seen today as an office consultation at the request of Dr. Larinda Buttery for evaluation of New Consultation (HERNIA) .  Patient saw his routine primary care team in October. Chronic conditions include bilateral dry eyes, benzodiazepine dependence, severe obesity, anxiety, attention or concentration deficit, hoarseness. The patient has a remote history of sleeve gastrectomy.  He has been on [facility] and has lost over 90 pounds in 3 months.  He has several concerns today. The first is his right inguinal hernia. He states while [place] the beach in the [place] and noticed a burning sensation in his right groin. He immediately noticed a golf ball size lump in his right groin. He was able to reduce it. The burning pain improved. Since then he has had to reduce it a couple times a week. He saw his primary care team and they ordered an ultrasound which was unremarkable. This then ultimately led to a CT scan.  He had a CT scan on October 7 which showed prior gastric sleeve with moderate size hiatal hernia, patulous esophagus, moderate fat-containing right inguinal hernia and a large stool burden  He is still able to reduce the inguinal hernia. It does cause discomfort at times. No dysuria or hematuria.  He reports that while he was hiking at [facility] the summer he passed out 4-5 times in a short period of time. He was on [facility] during that time. He states that he underwent a very exhaustive workup including cardiac as well as labs and no etiology was found. He has not had any more passing out episodes.  He states that he had heartburn since age 2. He also states that he had a known  hiatal hernia. He states that when he went in for his sleeve gastrectomy at [facility] in 2013 that he does not believe they repaired his hiatal hernia. But his heartburn resolved after that procedure. He has not really had any heartburn until the past few months where he has had an uptick in acid reflux. No dysphagia.  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 He would have lots of diarrhea. It started after his sleeve gastrectomy and not after his cholecystectomy. He states that he was tried on Questran without any improvement. He states that he has been on a GLP-1 medication for years which is help control his dumping and as result he does not really have it anymore.   Review of Systems: A complete review of systems was obtained from the patient. I have reviewed this information and discussed as appropriate with the patient. See HPI as well for other ROS.  ROS  Medical History: Past Medical History: Diagnosis Date Anxiety Arthritis Asthma, unspecified asthma severity, unspecified whether complicated, unspecified whether persistent (HHS-HCC) GERD (gastroesophageal reflux disease) Sleep apnea  There is no problem list on file for this patient.  Past Surgical History: Procedure Laterality Date CHOLECYSTECTOMY LT KNEE SURGERY   Allergies Allergen Reactions Penicillin Rash  Current Outpatient Medications on File Prior to Visit Medication Sig Dispense Refill buPROPion (WELLBUTRIN XL) 150 MG XL tablet Take 1 tablet by mouth once daily clonazePAM (KLONOPIN) 1 MG tablet Take 1 mg by mouth 2 (two)  times daily as needed DULoxetine (CYMBALTA) 60 MG DR capsule Take 60 mg by mouth once daily lisdexamfetamine (VYVANSE) 40 MG capsule Take 40 mg by mouth every morning MOUNJARO 10 mg/0.5 mL pen injector Inject 10 mg subcutaneously ondansetron (ZOFRAN-ODT) 8 MG disintegrating tablet Take 8 mg by mouth every 8 (eight) hours as needed QUVIVIQ 50 mg Tab Take 50 mg by  mouth at bedtime  No current facility-administered medications on file prior to visit.  Family History Problem Relation Age of Onset Obesity Mother Obesity Father Deep vein thrombosis (DVT or abnormal blood clot formation) Father Obesity Brother   Social History  Tobacco Use Smoking Status Former Types: Cigarettes Smokeless Tobacco Not on file   Social History  Socioeconomic History Marital status: Married Tobacco Use Smoking status: Former Types: Cigarettes Vaping Use Vaping status: Unknown Substance and Sexual Activity Alcohol use: Not Currently Drug use: Never  Objective:  Vitals: 08/18/23 1414 BP: 123/87 Pulse: 94 Temp: 36.7 C (98.1 F) SpO2: 98% Weight: 100.4 kg (221 lb 6.4 oz) Height: 180.3 cm (5\' 11" ) PainSc: 7  Body mass index is 30.88 kg/m.  Constitutional: NAD; conversant; no deformities Eyes: Moist conjunctiva; no lid lag; anicteric; PERRL Neck: Trachea midline; no thyromegaly Lungs: Normal respiratory effort; no tactile fremitus CV: RRR; no palpable thrills; no pitting edema GI: Abd soft, nontender, old trocar scars; no palpable hepatosplenomegaly; redundant skin in lower abdomen, palpable reducible right groin hernia. No inguinal hernia on the left MSK: Normal gait; no clubbing/cyanosis Psychiatric: Appropriate affect; alert and oriented x3 Lymphatic: No palpable cervical or axillary lymphadenopathy Skin: No rash, lesions or jaundice  Labs, Imaging and Diagnostic Testing: PCP note from June 28, 2023  CT abdomen pelvis October 7 CT ABDOMEN PELVIS FINDINGS  Hepatobiliary: No suspicious hepatic lesion. Gallbladder surgically absent. No biliary ductal dilation.  Pancreas: No pancreatic ductal dilation or evidence of acute inflammation.  Spleen: No splenomegaly.  Adrenals/Urinary Tract: Bilateral adrenal glands appear normal. No hydronephrosis. No renal, ureteral or bladder calculi. Urinary bladder is unremarkable for degree of  distension.  Stomach/Bowel: Radiopaque enteric contrast material traverses distal loops of small bowel. Prior gastric sleeve surgery with moderate-sized hiatal hernia. No pathologic dilation of small or large bowel. Normal appendix. Moderate volume of formed stool in the colon.  Vascular/Lymphatic: Normal caliber abdominal aorta. Smooth IVC contours. No pathologically enlarged abdominal or pelvic lymph nodes.  Reproductive: Prostate is unremarkable.  Other: Moderate-sized fat containing right inguinal hernia.  Musculoskeletal: Multilevel degenerative changes spine.  IMPRESSION: 1. Prior gastric sleeve surgery with moderate-sized hiatal hernia. 2. Patulous esophagus, which can be seen in the setting of esophageal dysmotility. 3. Moderate-sized fat containing right inguinal hernia. 4. Moderate volume of formed stool in the colon  Labs October 1 including CBC, zinc, B12, B1, vitamin D, folate, copper - normal Normal comprehensive metabolic panel from September 17  Ultrasound pelvis 05/25/2023  Assessment and Plan:   Diagnoses and all orders for this visit:  Right inguinal hernia  S/P laparoscopic sleeve gastrectomy  Hiatal hernia    We first addressed his right inguinal hernia. Given his pannus and excess skin in the lower abdomen I recommended a minimally invasive approach.  We discussed the etiology of inguinal hernias. We discussed the signs & symptoms of incarceration & strangulation. We discussed non-operative and operative management. We discussed open and minimally invasive approaches (laparoscopic/robotic)  The patient has elected robotic repair of right inguinal hernia with mesh  I described the procedure in detail. The patient was given Agricultural engineer. We  discussed the risks and benefits including but not limited to bleeding, infection, chronic inguinal pain, nerve entrapment, hernia recurrence, mesh complications, hematoma formation, urinary retention,  injury to the testicles numbness in the groin, blood clots, injury to the surrounding structures, and anesthesia risk. We also discussed the typical post operative recovery course, including no heavy lifting for 4-6 weeks. I explained that the likelihood of improvement of their symptoms is good  He would like to go ahead and get scheduled for his hernia repair.  He was hopeful that we could address the hiatal hernia at the same time as his inguinal hernia and I told him we could not. He needs more workup regarding his hiatal hernia. On imaging a portion of the sleeve is herniated above the diaphragm. Right now he is relatively asymptomatic. I think he has had the uptake and probably acid reflux symptoms because of the [facility]. Nonetheless I think he needs an upper endoscopy to evaluate any endoscopic changes of his esophagus and sleeve. He may ultimately need an upper GI as well but we will start with endoscopy. He has a gastroenterologist. I told him to contact his gastroenterologist because the patient is due for upper and lower endoscopies.  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 Typically if the patient has a recurrent hiatal hernia in the setting of reflux after sleeve gastrectomy we generally recommend correction or surgical repair of the hiatal hernia with conversion to gastric bypass. But with him already having some dumping and diarrhea issues I am not sure what the best course of action for him may be. A pure hiatal hernia repair may be best for him but we need more imaging first. He was also asked to sign a release note so we could get a copy of his operative note from [facility]  This patient encounter took 61 minutes today to perform the following: take history, perform exam, review outside records, interpret imaging, counsel the patient on their diagnosis and document encounter, findings & plan in the EHR  No follow-ups on file.  Brieanne Mignone Sherril Cong, MD General,  Minimally Invasive, & Bariatric Surgery     Electronically signed by Gara Kroner, MD at 08/18/2023 3:06 PM EST

## 2023-09-13 NOTE — Op Note (Signed)
PREOPERATIVE DIAGNOSIS: RIGHT inguinal hernia.    POSTOPERATIVE DIAGNOSIS: right  indirect inguinal hernia   PROCEDURE: Robotic/XI repair of right indirect inguinal hernia with  mesh (rTAPP).  Laparoscopic bilateral TAP block   SURGEON: Mary Sella. Andrey Campanile, MD    ASSISTANT SURGEON: None.    ANESTHESIA: General plus local consisting of 0.25% Marcaine with epi mixed with exparel.    ESTIMATED BLOOD LOSS: Minimal.    FINDINGS: The patient had a right indirect hernia.  It was repaired using Bard 3d midweight large right mesh.  A portion of his bladder was going into the right inguinal canal.  The bladder was decompressed with a Foley catheter.   SPECIMEN: Cord lipoma which was discarded   INDICATIONS FOR PROCEDURE: 52yo presented for repair of a symptomatic inguinal hernia. The risks and benefits including but not limited to bleeding, infection, chronic inguinal pain, nerve entrapment, hernia recurrence, mesh complications, hematoma formation, urinary retention, injury to the testicles or the ovaries, numbness in the groin, blood clots, injury to the surrounding structures, and anesthesia risk was discussed with the patient.   DESCRIPTION OF PROCEDURE: After obtaining verbal consent the patient was then taken back to the operating room, placed  supine on the operating room table. General endotracheal anesthesia was  established. The patient had emptied their bladder prior to going back to  the operating room. Sequential compression devices were placed. The  abdomen and groin were prepped and draped in the usual standard surgical  fashion with ChloraPrep. The patient received oral Tylenol preoperatively as well as IV  antibiotics prior to the incision. A surgical time-out was performed.  Local was infiltrated at the base of the umbilicus.     Optical entry was made using the Optiview technique in the left midclavicular line about 8 cm lateral to the umbilicus a few centimeters below the left  subcostal margin.  Using a 0 degree 5 mm laparoscope through a 5 mm trocar I was able to advance the laparoscope carefully through all layers of the abdominal wall and carefully entered the abdominal cavity.  Pneumoperitoneum was smoothly established up to a patient pressure of 15 mmHg without any change in patient vital signs.  There is no evidence of injury to surrounding structures.  A bilateral laparoscopic tap block was performed for postoperative pain relief  The inguinal areas were inspected and there was evidence of a right indirect inguinal hernia and no evidence of a contralateral inguinal hernia.  A portion of his bladder was telescoping into the right inguinal canal.  Therefore I had the circulating nurse placed a Foley catheter underneath the surgical drapes.  This provided excellent decompression of the bladder..  Patient was placed in Trendelenburg position.  A robotic 8 mm trocar was placed in the supraumbilical position about 18 cm from the pubic bone.  An additional 8 mm robotic trocar was placed in the right lateral abdominal wall.  The optical entry trocar was exchanged for an 8 mm robotic trocar.  A large piece of right Bard 3D midweight  mesh were placed through the robotic trocar into the abdominal cavity.  I went ahead and placed two  3-0 Vicryl sutures  on SH into the abdomen off to the side.  I then placed one 3-0 absorbable V-Loc sutures through the [facility] trocar into the abdomen off to the side.    We then deployed the robot for pelvic surgery. The robot was docked.  Robotic laparoscope was placed through the supraumbilical trocar and the anatomy  was targeted.  The other arms were then connected to the trochars.  A pair of MCS scissors was placed through the right trocar and a fenestrated bipolar through the left trocar all under direct visualization.  I then scrubbed out and went to the robotic console.    I then made incision along the peritoneum on the right, starting 2  inches above the anterior superior iliac spine and caring it medial toward the median umbilical ligament in a lazy S configuration using MCS scissors with electrocautery. The peritoneal flap was then gently dissected downward from the anterior abdominal wall taking care not to  injure the inferior epigastric vessels. The pubic bone was identified.  Dissection continued about 2 cm below the level of the pubic bone.  There is no evidence of obturator or femoral canal hernias.  The testicular vessels were identified.  Using traction and counter traction, I reduced the sac in  its entirety.  The bladder was also reduced staying outside of the bladder.  The testicular vessels had been identified and preserved. The vas deferens was identified and preserved, and the hernia sac was stripped from those to  surrounding structures.  A small cord lipoma was also stripped from the vas deferens and cord structures.   I then went about creating a large pocket by lifting the peritoneum of the pelvic floor. I took great care not to injure the iliac vessels.      I then obtained the previously placed piece of Bard large 3D right max mesh for the right groin and placed it into the inguinal area.   half of it covered medial  to the inferior epigastric vessels and half of it lateral to the  inferior epigastric vessels. The defect was well  covered with the mesh.  I then secured the mesh to Cooper's ligament with two interrupted 3-0 Vicryl suture.  I placed an additional suture superior medially along the edge of the mesh medial to the inferior gastric vessel.  I placed a 3 Vicryl suture laterally along the superior lateral edge of the mesh lateral to the inferior epigastric vessels.  I then closed the peritoneal flap with a running 3-0 Vicryl V-Loc.  The mesh was well covered.  There is no defect in the peritoneal flap.  The mesh was flat.  It had not curled up.   The surgical robot was undocked and moved away from the OR  table.  I scrubbed back in.    we then placed a laparoscopic needle driver and the three sutures that had been placed in the abdomen at the begin the case were removed  There was no evidence of injury to surrounding structures. Pneumoperitoneum was released, and the remaining trocars were removed.  The trocar that had the ablation going through it had pulled back a little bit into the soft tissue as a result there was some soft tissue swelling across his lower abdomen.  All skin incisions  were closed with a 4-0 Monocryl in a subcuticular fashion followed by application of Dermabond. All needle, instrument, and sponge counts  were correct x2.   There are no immediate complications.  Foley catheter was removed. The patient tolerated the procedure well. The patient was extubated and taken to the  recovery room in stable condition.  Mary Sella. Andrey Campanile, MD, FACS General, Bariatric, & Minimally Invasive Surgery [facility],  [facility]

## 2023-09-13 NOTE — Anesthesia Procedure Notes (Signed)
Procedure Name: Intubation Date/Time: 09/13/2023 9:27 AM  Performed by: Randa Evens, CRNAPre-anesthesia Checklist: Patient identified, Emergency Drugs available, Suction available and Patient being monitored Patient Re-evaluated:Patient Re-evaluated prior to induction Oxygen Delivery Method: Circle System Utilized Preoxygenation: Pre-oxygenation with 100% oxygen Induction Type: IV induction Ventilation: Mask ventilation without difficulty Laryngoscope Size: Mac and 4 Grade View: Grade I Tube type: Oral Tube size: 7.5 mm Number of attempts: 1 Airway Equipment and Method: Stylet and Oral airway Placement Confirmation: ETT inserted through vocal cords under direct vision, positive ETCO2 and breath sounds checked- equal and bilateral Secured at: 23 cm Tube secured with: Tape Dental Injury: Teeth and Oropharynx as per pre-operative assessment

## 2023-09-13 NOTE — Interval H&P Note (Signed)
History and Physical Interval Note:  09/13/2023 8:49 AM  [name]  has presented today for surgery, with the diagnosis of RIGHT INGUINAL HERNIA.  The various methods of treatment have been discussed with the patient and family. After consideration of risks, benefits and other options for treatment, the patient has consented to  Procedure(s): XI ROBOTIC ASSISTED RIGHT INGUINAL HERNIA REPAIR WITH MESH (Right) as a surgical intervention.  The patient's history has been reviewed, patient examined, no change in status, stable for surgery.  I have reviewed the patient's chart and labs.  Questions were answered to the patient's satisfaction.    Denies changes States best oral pain control has been with oral dilaudid. Issues with oral narcotics - vicodin, tramadol, oxycodone - severe itching Jared Tran

## 2023-09-14 ENCOUNTER — Encounter (HOSPITAL_COMMUNITY): Payer: Self-pay | Admitting: General Surgery

## 2023-09-25 ENCOUNTER — Other Ambulatory Visit: Payer: Self-pay | Admitting: Medical-Surgical

## 2023-09-25 ENCOUNTER — Other Ambulatory Visit (HOSPITAL_BASED_OUTPATIENT_CLINIC_OR_DEPARTMENT_OTHER): Payer: Self-pay

## 2023-09-26 ENCOUNTER — Other Ambulatory Visit (HOSPITAL_BASED_OUTPATIENT_CLINIC_OR_DEPARTMENT_OTHER): Payer: Self-pay

## 2023-09-26 ENCOUNTER — Other Ambulatory Visit: Payer: Self-pay

## 2023-09-29 ENCOUNTER — Ambulatory Visit: Payer: Commercial Managed Care - PPO | Admitting: Medical-Surgical

## 2023-09-29 ENCOUNTER — Other Ambulatory Visit (HOSPITAL_BASED_OUTPATIENT_CLINIC_OR_DEPARTMENT_OTHER): Payer: Self-pay

## 2023-09-29 ENCOUNTER — Encounter: Payer: Self-pay | Admitting: Medical-Surgical

## 2023-09-29 VITALS — BP 106/70 | HR 88 | Resp 20 | Ht 71.0 in | Wt 207.5 lb

## 2023-09-29 DIAGNOSIS — F5104 Psychophysiologic insomnia: Secondary | ICD-10-CM | POA: Diagnosis not present

## 2023-09-29 DIAGNOSIS — K219 Gastro-esophageal reflux disease without esophagitis: Secondary | ICD-10-CM

## 2023-09-29 DIAGNOSIS — F419 Anxiety disorder, unspecified: Secondary | ICD-10-CM

## 2023-09-29 DIAGNOSIS — F1320 Sedative, hypnotic or anxiolytic dependence, uncomplicated: Secondary | ICD-10-CM | POA: Insufficient documentation

## 2023-09-29 DIAGNOSIS — G4733 Obstructive sleep apnea (adult) (pediatric): Secondary | ICD-10-CM | POA: Diagnosis not present

## 2023-09-29 DIAGNOSIS — N522 Drug-induced erectile dysfunction: Secondary | ICD-10-CM | POA: Diagnosis not present

## 2023-09-29 DIAGNOSIS — R41840 Attention and concentration deficit: Secondary | ICD-10-CM

## 2023-09-29 MED ORDER — BUPROPION HCL ER (XL) 300 MG PO TB24
300.0000 mg | ORAL_TABLET | Freq: Every day | ORAL | 1 refills | Status: DC
  Filled 2023-09-29: qty 90, 90d supply, fill #0
  Filled 2023-12-27: qty 90, 90d supply, fill #1

## 2023-09-29 MED ORDER — TIRZEPATIDE 12.5 MG/0.5ML ~~LOC~~ SOAJ
12.5000 mg | SUBCUTANEOUS | 0 refills | Status: DC
  Filled 2023-09-29 (×2): qty 2, 28d supply, fill #0

## 2023-09-29 NOTE — Progress Notes (Signed)
        Established patient visit  History, exam, impression, and plan:  1. Attention or concentration deficit (Primary) Pleasant 53 year old male presenting today with history of attention deficit.  He is currently taking Vyvanse  40 mg daily, tolerating well without side effects.  Feels that the medication is good at this dose and helps him focus without overthinking or hyper fixating.  Previously trialed higher doses but this was not well-tolerated.  Blood pressure is under control.  No unexpected weight fluctuations or sleep interruptions related to the medication.  Plan to continue Vyvanse  40 mg daily.  2. Psychophysiological insomnia Long history of insomnia with multiple medications tried and failed.  Currently taking Quviviq  50 mg nightly which seems to work fairly well.  Does endorse still using ZzzQuil along with it sometimes.  As this has been a chronic issue that is resistant to treatment, plan to continue Quviviq  50 mg nightly.  Limit use of ZzzQuil as much as possible.  3. Anxiety Currently taking Cymbalta  60 mg daily and feels that the medication has actually made quite a bit of difference in his overall anxiety levels and mood.  Feels that he is at a good place mentally and is able to manage stress better on a daily basis.  Continue Cymbalta  60 mg daily.  4. Benzodiazepine dependence (HCC) Continues to use clonazepam  1 to 2 mg daily but on an as-needed basis.  Aware of recommendations to limit the use of this medication as much as possible and only for severe anxiety/panic that does not resolve with other interventions.  7. Drug-induced erectile dysfunction Since starting Cymbalta , notes that he has the desire for sexual interaction however he is experienced some difficulty achieving/maintaining an erection.  This is a big concern for him.  Taking Wellbutrin  150 mg daily to counteract the effects of Cymbalta  however this has not made much of a difference.  Admits to skipping some  doses of Wellbutrin  so that he can double up his dosing to see if that would help but so far has seen no benefit.  Discussed options for erectile dysfunction including Viagra and Cialis.  He is not interested in these at this time.  Plan to increase Wellbutrin  to 300 mg daily to see if a longer term on this will help.  Procedures performed this visit: None.  Return in about 3 months (around 12/28/2023) for chronic disease follow up.  I spent >45 minutes on the day of the encounter to include pre-visit record review, face-to-face time with the patient and post visit ordering of test.  __________________________________ Jared FREDRIK Palin, DNP, APRN, FNP-BC Primary Care and Sports Medicine [facility] [place]

## 2023-09-30 ENCOUNTER — Other Ambulatory Visit: Payer: Self-pay

## 2023-09-30 ENCOUNTER — Other Ambulatory Visit (HOSPITAL_BASED_OUTPATIENT_CLINIC_OR_DEPARTMENT_OTHER): Payer: Self-pay

## 2023-09-30 MED ORDER — LISDEXAMFETAMINE DIMESYLATE 40 MG PO CAPS: 40.0000 mg | ORAL_CAPSULE | ORAL | 0 refills | Status: DC

## 2023-09-30 MED ORDER — LISDEXAMFETAMINE DIMESYLATE 40 MG PO CAPS
40.0000 mg | ORAL_CAPSULE | ORAL | 0 refills | Status: DC
  Filled 2023-09-30: qty 30, 30d supply, fill #0

## 2023-09-30 MED ORDER — LISDEXAMFETAMINE DIMESYLATE 40 MG PO CAPS
40.0000 mg | ORAL_CAPSULE | ORAL | 0 refills | Status: DC
  Filled 2023-10-31: qty 30, 30d supply, fill #0

## 2023-10-07 ENCOUNTER — Telehealth: Payer: Self-pay | Admitting: Medical-Surgical

## 2023-10-07 DIAGNOSIS — G4733 Obstructive sleep apnea (adult) (pediatric): Secondary | ICD-10-CM

## 2023-10-07 DIAGNOSIS — E6609 Other obesity due to excess calories: Secondary | ICD-10-CM

## 2023-10-07 NOTE — Telephone Encounter (Signed)
 Initiated Prior authorization for: Mounjaro Via: Covermymeds Case/Key: [id number] Status: Pending as of 10/07/23 3:13 PM  Notified Pt via: Mychart

## 2023-10-12 ENCOUNTER — Other Ambulatory Visit (HOSPITAL_BASED_OUTPATIENT_CLINIC_OR_DEPARTMENT_OTHER): Payer: Self-pay

## 2023-10-12 MED ORDER — ZEPBOUND 12.5 MG/0.5ML ~~LOC~~ SOAJ
12.5000 mg | SUBCUTANEOUS | 0 refills | Status: DC
  Filled 2023-10-12: qty 2, 28d supply, fill #0
  Filled 2023-10-19 – 2023-10-24 (×3): qty 6, 84d supply, fill #0
  Filled 2023-10-26 (×2): qty 2, 28d supply, fill #0

## 2023-10-12 NOTE — Telephone Encounter (Signed)
 Plan to retry for Zepbound  for weight management/OSA. Order placed.  ___________________________________________ Maryl Snook, DNP, APRN, FNP-BC Primary Care and Sports Medicine [facility] [place]

## 2023-10-12 NOTE — Addendum Note (Signed)
 Addended byCherre Cornish on: 10/12/2023 02:57 PM   Modules accepted: Orders

## 2023-10-12 NOTE — Telephone Encounter (Signed)
 Prior auth for: MOUNJARO  2.5 MG  Determination: DENIED Date: 10/07/2023 Reason: Patient must have a diagnosis of DM2. Patient must be resistant to insulin. Sleep apnea, weight loss or weight management not clinically supported.

## 2023-10-13 ENCOUNTER — Other Ambulatory Visit (HOSPITAL_BASED_OUTPATIENT_CLINIC_OR_DEPARTMENT_OTHER): Payer: Self-pay

## 2023-10-13 MED ORDER — GABAPENTIN 100 MG PO CAPS
200.0000 mg | ORAL_CAPSULE | Freq: Three times a day (TID) | ORAL | 0 refills | Status: DC
  Filled 2023-10-13: qty 42, 7d supply, fill #0

## 2023-10-19 ENCOUNTER — Other Ambulatory Visit (HOSPITAL_BASED_OUTPATIENT_CLINIC_OR_DEPARTMENT_OTHER): Payer: Self-pay

## 2023-10-24 ENCOUNTER — Other Ambulatory Visit: Payer: Self-pay

## 2023-10-24 ENCOUNTER — Other Ambulatory Visit (HOSPITAL_BASED_OUTPATIENT_CLINIC_OR_DEPARTMENT_OTHER): Payer: Self-pay

## 2023-10-25 ENCOUNTER — Other Ambulatory Visit: Payer: Self-pay

## 2023-10-25 ENCOUNTER — Other Ambulatory Visit (HOSPITAL_BASED_OUTPATIENT_CLINIC_OR_DEPARTMENT_OTHER): Payer: Self-pay

## 2023-10-26 ENCOUNTER — Other Ambulatory Visit (HOSPITAL_BASED_OUTPATIENT_CLINIC_OR_DEPARTMENT_OTHER): Payer: Self-pay

## 2023-10-27 ENCOUNTER — Other Ambulatory Visit (HOSPITAL_BASED_OUTPATIENT_CLINIC_OR_DEPARTMENT_OTHER): Payer: Self-pay

## 2023-10-31 ENCOUNTER — Other Ambulatory Visit (HOSPITAL_BASED_OUTPATIENT_CLINIC_OR_DEPARTMENT_OTHER): Payer: Self-pay

## 2023-11-17 ENCOUNTER — Other Ambulatory Visit (HOSPITAL_BASED_OUTPATIENT_CLINIC_OR_DEPARTMENT_OTHER): Payer: Self-pay

## 2023-11-28 ENCOUNTER — Other Ambulatory Visit: Payer: Self-pay | Admitting: General Surgery

## 2023-11-28 DIAGNOSIS — K449 Diaphragmatic hernia without obstruction or gangrene: Secondary | ICD-10-CM

## 2023-11-28 DIAGNOSIS — Z903 Acquired absence of stomach [part of]: Secondary | ICD-10-CM

## 2023-12-05 ENCOUNTER — Other Ambulatory Visit: Payer: Self-pay

## 2023-12-05 ENCOUNTER — Other Ambulatory Visit (HOSPITAL_BASED_OUTPATIENT_CLINIC_OR_DEPARTMENT_OTHER): Payer: Self-pay

## 2023-12-05 ENCOUNTER — Encounter: Payer: Self-pay | Admitting: Medical-Surgical

## 2023-12-06 ENCOUNTER — Other Ambulatory Visit (HOSPITAL_BASED_OUTPATIENT_CLINIC_OR_DEPARTMENT_OTHER): Payer: Self-pay

## 2023-12-07 ENCOUNTER — Other Ambulatory Visit: Payer: Self-pay

## 2023-12-07 ENCOUNTER — Other Ambulatory Visit (HOSPITAL_BASED_OUTPATIENT_CLINIC_OR_DEPARTMENT_OTHER): Payer: Self-pay

## 2023-12-07 NOTE — Telephone Encounter (Signed)
 PA initiated for Quviviq on 12/07/2023

## 2023-12-08 ENCOUNTER — Other Ambulatory Visit (HOSPITAL_BASED_OUTPATIENT_CLINIC_OR_DEPARTMENT_OTHER): Payer: Self-pay

## 2023-12-19 ENCOUNTER — Other Ambulatory Visit (HOSPITAL_BASED_OUTPATIENT_CLINIC_OR_DEPARTMENT_OTHER): Payer: Self-pay

## 2023-12-20 ENCOUNTER — Ambulatory Visit
Admission: RE | Admit: 2023-12-20 | Discharge: 2023-12-20 | Disposition: A | Discharge status: Home / Self Care | Source: Ambulatory Visit | Attending: General Surgery | Admitting: General Surgery

## 2023-12-20 DIAGNOSIS — K219 Gastro-esophageal reflux disease without esophagitis: Secondary | ICD-10-CM | POA: Diagnosis not present

## 2023-12-20 DIAGNOSIS — K449 Diaphragmatic hernia without obstruction or gangrene: Secondary | ICD-10-CM

## 2023-12-20 DIAGNOSIS — Z903 Acquired absence of stomach [part of]: Secondary | ICD-10-CM

## 2023-12-22 DIAGNOSIS — F902 Attention-deficit hyperactivity disorder, combined type: Secondary | ICD-10-CM | POA: Diagnosis not present

## 2023-12-28 ENCOUNTER — Other Ambulatory Visit (HOSPITAL_BASED_OUTPATIENT_CLINIC_OR_DEPARTMENT_OTHER): Payer: Self-pay

## 2023-12-28 ENCOUNTER — Encounter: Payer: Self-pay | Admitting: Medical-Surgical

## 2023-12-28 ENCOUNTER — Other Ambulatory Visit: Payer: Self-pay

## 2023-12-28 ENCOUNTER — Ambulatory Visit: Payer: Commercial Managed Care - PPO | Admitting: Medical-Surgical

## 2023-12-28 VITALS — BP 96/64 | HR 72 | Resp 20 | Ht 71.0 in | Wt 210.9 lb

## 2023-12-28 DIAGNOSIS — F5104 Psychophysiologic insomnia: Secondary | ICD-10-CM | POA: Diagnosis not present

## 2023-12-28 DIAGNOSIS — G4733 Obstructive sleep apnea (adult) (pediatric): Secondary | ICD-10-CM

## 2023-12-28 DIAGNOSIS — F1320 Sedative, hypnotic or anxiolytic dependence, uncomplicated: Secondary | ICD-10-CM | POA: Diagnosis not present

## 2023-12-28 DIAGNOSIS — F902 Attention-deficit hyperactivity disorder, combined type: Secondary | ICD-10-CM | POA: Diagnosis not present

## 2023-12-28 DIAGNOSIS — R41840 Attention and concentration deficit: Secondary | ICD-10-CM | POA: Diagnosis not present

## 2023-12-28 DIAGNOSIS — Z7689 Persons encountering health services in other specified circumstances: Secondary | ICD-10-CM | POA: Diagnosis not present

## 2023-12-28 MED ORDER — BUPROPION HCL ER (XL) 300 MG PO TB24
300.0000 mg | ORAL_TABLET | Freq: Every day | ORAL | 3 refills | Status: DC
  Filled 2023-12-28: qty 90, 90d supply, fill #0
  Filled 2024-03-06 – 2024-03-22 (×2): qty 90, 90d supply, fill #1
  Filled 2024-06-04 – 2024-06-05 (×2): qty 90, 90d supply, fill #2
  Filled 2024-06-05: qty 60, 60d supply, fill #0
  Filled 2024-06-05: qty 90, 90d supply, fill #0
  Filled 2024-08-30: qty 60, 60d supply, fill #1
  Filled ????-??-?? (×2): fill #2

## 2023-12-28 MED ORDER — LISDEXAMFETAMINE DIMESYLATE 70 MG PO CAPS
70.0000 mg | ORAL_CAPSULE | Freq: Every day | ORAL | 0 refills | Status: DC
  Filled 2023-12-28: qty 30, 30d supply, fill #0

## 2023-12-28 MED ORDER — CLONAZEPAM 1 MG PO TABS
1.0000 mg | ORAL_TABLET | Freq: Two times a day (BID) | ORAL | 5 refills | Status: DC | PRN
  Filled 2023-12-28 – 2024-02-07 (×2): qty 60, 30d supply, fill #0
  Filled 2024-03-06 – 2024-04-09 (×3): qty 60, 30d supply, fill #1
  Filled 2024-05-14: qty 60, 30d supply, fill #2
  Filled 2024-06-17: qty 60, 30d supply, fill #0

## 2023-12-28 MED ORDER — TRAZODONE HCL 50 MG PO TABS
50.0000 mg | ORAL_TABLET | Freq: Every evening | ORAL | 0 refills | Status: DC | PRN
  Filled 2023-12-28: qty 60, 30d supply, fill #0

## 2023-12-28 NOTE — Progress Notes (Signed)
 Established patient visit  History, exam, impression, and plan:  1. Attention or concentration deficit (Primary) Pleasant 53 year old male presenting today for follow up on attention deficit.  Has been taking Wellbutrin XL 300 mg daily as an adjunct to help with ADHD symptoms.  Has been prescribed Vyvanse 40 mg daily that he was taking as prescribed, tolerating well without side effects.  With a recent discontinuation of Mounjaro secondary to new further insurance coverage, he notes that he is having diarrhea which reduces his absorption of the medications.  He had an old prescription for the 75 mg dose of Vyvanse that he started taking because the 40 mg did not seem to be working.  He noted an improvement in his symptoms with the 70 mg dose and when this ran out, he used old 60 mg doses to supplement.  When the 60 mg dose is ran out, he admits to taking 2 of the 40 mg dose that he had on hand at the same time in order to reach the desired efficacy.  After education that this was exceeding the maximum daily dose and not recommended, he reports that he was opening the capsules and pouring a little out so that he would be closer to the 70 mg dose.  Discussed the importance of taking medication as it is prescribed and that doubling or adjusting doses equates to abuse of the medication.  Patient verbalizes understanding.  We did discuss that I feel that he needs psychiatric intervention for medication management however he is hesitant to consider this.  Planning to do a GeneSight evaluation and once the report is back, we will reevaluate the need for referral to psychiatry.  Refilling Wellbutrin.  Refilling Vyvanse at the 70 mg dose for now but further refills will need discussion. - buPROPion (WELLBUTRIN XL) 300 MG 24 hr tablet; Take 1 tablet (300 mg total) by mouth daily.  Dispense: 90 tablet; Refill: 3  2. Psychophysiological insomnia Long history of psychophysiological insomnia that has been  resistant to multiple medications.  Most recently prescribed Quviviq 50 mg nightly for sleep.  Reports that this was not working for him however it did seem to help him get back to sleep quicker after waking in middle of the night.  Has continued taking ZzzQuil along with the prescribed medication because he feels he has to in order to be able to get to sleep in a timely manner.  He stopped the medication because it was not working but has continued to take ZzzQuil.  Now using his as needed Klonopin at night although this was intended for daytime use and not to help with sleep.  In the past, he has tried amitriptyline, gabapentin, Ambien, and several others without benefit.  He has traditionally been hesitant to consider any mood altering medications due to worry over his testosterone levels.  Today he reports that he is open to what ever options may help him sleep.  Discontinuing Quviviq.  Starting trazodone 50-100 mg nightly.  Avoid using Klonopin at night to help with sleep.  3. Encounter for weight management Currently taking Wellbutrin 300 mg daily but does not feel that this medication is helpful for weight loss.  Was previously doing well on Mounjaro and has been able to attain 190 pounds which was at his goal.  Unfortunately, insurance will no longer cover this and he is experiencing a surge in appetite, recurrence of functional diarrhea, and weight gain.  Today, he has gained approximately  9-10 pounds and is very worried about this.  Unfortunately, no weight loss medications are covered by his current insurance plan.  We did briefly discuss LipoSlim using a local compounding pharmacy but unclear if this is covered or not. Plan to check into this and let patient know what I find so he can decide if this is something that he'd like to try.  - buPROPion (WELLBUTRIN XL) 300 MG 24 hr tablet; Take 1 tablet (300 mg total) by mouth daily.  Dispense: 90 tablet; Refill: 3  4. Benzodiazepine dependence  (HCC) Has been using benzodiazepines regularly for many years.  On clonazepam that has ordered 1 mg twice daily as needed for anxiety.  Previously taking 1-2 mg in the morning to get through the day.  In previous visits, we have continued to discuss the importance to use benzodiazepines on an as-needed basis rather than something that is scheduled.  Today he reports that he has not been taking this in the morning or throughout the day as he previously did but has now moved his dose to nighttime where he takes 1 mg at bedtime to help with sleep.  Advised that this is not recommended and the medication should be used for anxiety rather than sleep.  See above for recommendations on sleep management.  Continue clonazepam but only use for severe anxiety that is not responsive to other measures.  Do not use for a scheduled medication first thing in the morning to get through the day. - clonazePAM (KLONOPIN) 1 MG tablet; Take 1 tablet (1 mg total) by mouth 2 (two) times daily as needed for anxiety.  Dispense: 60 tablet; Refill: 5  5. OSA on CPAP Has a history of sleep apnea and is currently using CPAP as recommended.  Tolerating well overall.  Current on supplies and denies any need for prescription renewals.   Procedures performed this visit: None.  No follow-ups on file.  __________________________________ Thayer Ohm, DNP, APRN, FNP-BC Primary Care and Sports Medicine [facility] [place]

## 2023-12-29 ENCOUNTER — Encounter: Payer: Self-pay | Admitting: Medical-Surgical

## 2023-12-30 ENCOUNTER — Other Ambulatory Visit (HOSPITAL_BASED_OUTPATIENT_CLINIC_OR_DEPARTMENT_OTHER): Payer: Self-pay

## 2023-12-30 DIAGNOSIS — R3911 Hesitancy of micturition: Secondary | ICD-10-CM | POA: Diagnosis not present

## 2023-12-30 DIAGNOSIS — R3915 Urgency of urination: Secondary | ICD-10-CM | POA: Diagnosis not present

## 2023-12-30 DIAGNOSIS — N401 Enlarged prostate with lower urinary tract symptoms: Secondary | ICD-10-CM | POA: Diagnosis not present

## 2023-12-30 MED ORDER — TAMSULOSIN HCL 0.4 MG PO CAPS
0.4000 mg | ORAL_CAPSULE | Freq: Every day | ORAL | 11 refills | Status: DC
  Filled 2023-12-30: qty 30, 30d supply, fill #0

## 2024-01-04 DIAGNOSIS — F902 Attention-deficit hyperactivity disorder, combined type: Secondary | ICD-10-CM | POA: Diagnosis not present

## 2024-01-06 ENCOUNTER — Encounter: Payer: Self-pay | Admitting: Medical-Surgical

## 2024-01-11 ENCOUNTER — Other Ambulatory Visit (HOSPITAL_COMMUNITY): Payer: Self-pay

## 2024-01-11 NOTE — Telephone Encounter (Signed)
 Yes, looks like both were done for [facility], first and then Zepbound was attempted later. Mounjaro is only indicated for type 2 diabetes, Zepbound would be indicated for OSA, however, pt has [employer] and [facility] currently isn't covering ANY non diabetes related GLP-1's, with or without appeal. Hopefully this helps clarify for pt. Thank you

## 2024-01-20 NOTE — Telephone Encounter (Signed)
 This request has been handled. No further actions required. Approval on file from 12/07/23 - 06/08/24 for Quviviq . Mounjaro  and Wegovy  were denied by the insurance.

## 2024-01-23 ENCOUNTER — Encounter: Payer: Self-pay | Admitting: Gastroenterology

## 2024-01-23 ENCOUNTER — Other Ambulatory Visit (HOSPITAL_BASED_OUTPATIENT_CLINIC_OR_DEPARTMENT_OTHER): Payer: Self-pay

## 2024-01-23 ENCOUNTER — Ambulatory Visit: Admitting: Gastroenterology

## 2024-01-23 VITALS — BP 118/72 | HR 86 | Ht 72.0 in | Wt 213.0 lb

## 2024-01-23 DIAGNOSIS — K449 Diaphragmatic hernia without obstruction or gangrene: Secondary | ICD-10-CM

## 2024-01-23 DIAGNOSIS — Z9884 Bariatric surgery status: Secondary | ICD-10-CM | POA: Diagnosis not present

## 2024-01-23 DIAGNOSIS — K529 Noninfective gastroenteritis and colitis, unspecified: Secondary | ICD-10-CM

## 2024-01-23 DIAGNOSIS — R197 Diarrhea, unspecified: Secondary | ICD-10-CM

## 2024-01-23 DIAGNOSIS — Z860100 Personal history of colon polyps, unspecified: Secondary | ICD-10-CM

## 2024-01-23 DIAGNOSIS — K219 Gastro-esophageal reflux disease without esophagitis: Secondary | ICD-10-CM

## 2024-01-23 DIAGNOSIS — E538 Deficiency of other specified B group vitamins: Secondary | ICD-10-CM | POA: Diagnosis not present

## 2024-01-23 MED ORDER — NA SULFATE-K SULFATE-MG SULF 17.5-3.13-1.6 GM/177ML PO SOLN
1.0000 | ORAL | 0 refills | Status: DC
  Filled 2024-01-23: qty 354, 1d supply, fill #0

## 2024-01-23 NOTE — Patient Instructions (Addendum)
 _______________________________________________________  If your blood pressure at your visit was 140/90 or greater, please contact your primary care physician to follow up on this.  If you are age 55 or younger, your body mass index should be between 19-25. Your Body mass index is 28.89 kg/m. If this is out of the aformentioned range listed, please consider follow up with your Primary Care Provider.  ________________________________________________________  The [place] [facility] providers would like to encourage you to use MYCHART to communicate with providers for non-urgent requests or questions.  Due to long hold times on the telephone, sending your provider a message by [facility] may be a faster and more efficient way to get a response.  Please allow 48 business hours for a response.  Please remember that this is for non-urgent requests.  _______________________________________________________  [name] have been scheduled for an endoscopy and colonoscopy. Please follow the written instructions given to you at your visit today.  If you use inhalers (even only as needed), please bring them with you on the day of your procedure.  DO NOT TAKE 7 DAYS PRIOR TO TEST- Trulicity (dulaglutide) Ozempic , Wegovy  (semaglutide ) Mounjaro  (tirzepatide ) Bydureon Bcise (exanatide extended release)  DO NOT TAKE 1 DAY PRIOR TO YOUR TEST Rybelsus  (semaglutide ) Adlyxin (lixisenatide) Victoza (liraglutide) Byetta (exanatide) ___________________________________________________________________________  Due to recent changes in healthcare laws, you may see the results of your imaging and laboratory studies on MyChart before your provider has had a chance to review them.  We understand that in some cases there may be results that are confusing or concerning to you. Not all laboratory results come back in the same time frame and the provider may be waiting for multiple results in order to interpret others.  Please give us  48  hours in order for your provider to thoroughly review all the results before contacting the office for clarification of your results.   It was a pleasure to see you today!  Vito Cirigliano, D.O.

## 2024-01-23 NOTE — Progress Notes (Signed)
 Chief Complaint: Hiatal hernia, abnormal imaging   Referring Provider:   Aldean Hummingbird, MD    HPI:     Jared Tran is a 53 y.o. male with a history of ADD, anxiety, insomnia, previous gastric sleeve, delayed sleep phase syndrome, OSA (on CPAP), ccy, referred to the Gastroenterology Clinic for evaluation of preoperative EGD, hiatal hernia, and chronic diarrhea.  He was recently evaluated by Dr. Elvan Hamel in the Bariatric Surgery clinic after right inguinal hernia repair on 09/13/2023.  Has been having occasional heartburn, particularly nocturnal heartburn/regurgitation and was referred for UGI series (as below) and to the GI clinic for further evaluation.  - 12/20/2023: UGI series: Moderate amount of gastroesophageal reflux to the level of the thoracic inlet, 13 mm barium tablet initially stuck at GE junction and eventually passed 30 seconds later, moderate hiatal hernia with about half the stomach in the chest, postsurgical changes from prior sleeve gastrectomy.  Normal gastric emptying.  Today, he states he continues to have reflux symptoms.  He has been treated with multiple agents over the years, including high-dose PPI, H2 RA, antacids, until vertical gastric sleeve in 2013.  Not currently taking any acid suppression therapy.  Additionally, continues to have chronic diarrhea, fecal urgency, and episodic urge fecal incontinence. He was previously seen in the GI clinic on 10/01/2020 for similar symptoms, and has had chronic diarrhea dating back to either 2013 at time of sleeve gastrectomy or 2012 at time of cholecystectomy.  Prior to that, at least 15-year history of various GI symptoms, to include GERD, intermittent loose stools.  He reports having 1-2 loose stool w/ urgency and explosive diarrhea most mornings after breakfast.  He reports improvement with GLP-1's, but those are no longer covered by his insurance.  Symptoms were worse with recent trial of Flomax . Sxs improve with  Imodium, but not currently taking as it typically requires 4-6 pills/day to control. No prior trial of budesonide.   At that time, recommended EGD with biopsies and colonoscopy with biopsies (not done), stool studies (not done), negative celiac panel, and consideration of retrial of Questran since he had good response in the past along with consideration for Victoza or similar agent such as Saxenda in concert with Bariatric surgery.  He never presented for EGD/colonoscopy, stool studies, and had not followed up in the GI clinic until now.  Reports first colonoscopy at age 48 and has had colonoscopy every 5 years since then due to history of colon polyps. Last was age 25.    Seen by Dr. Socorro Dunks at [facility] in 09/2016 for evaluation of chronic diarrhea.  Was prescribed Questran with improvement but subsequently stopped taking.  Prior to that was followed by Dr. Mixon. Had also used Victoza in the past with improvement, but became cost prohibitive with insurance change.    Does have a history of mixed hiatal and paraesophageal hernia on UGI series in 2014, performed following sleeve gastrectomy.    Reviewed most recent labs from 2024: Normal CBC, CMP, PT/INR, iron panel, B12, thiamine, vitamin D , copper , TSH.  Folate 3.7.  Separately, due for repeat colonoscopy for colon cancer screening.  Endoscopic History: - Colonoscopy (07/20/2004): Small rectal hyperplastic polyp, otherwise normal study - EGD (05/20/2004): Slightly loose gastroesophageal junction with small hiatal hernia and erosive esophagitis (path: Reflux esophagitis, negative for intestinal metaplasia).  Normal stomach and duodenum.  Trialed course of Nexium. - Office appointment (10/01/2009: Heartburn, regurgitation.  Long discussion  regarding antireflux surgical options. - EGD (10/31/2009): Benign-appearing esophageal stricture, dilated with 54 French Maloney dilator with mild resistance.  Z-line irregular (path: Chronically inflamed  gastric cardia.  Negative for H. pylori.  No Barrett's esophagus.  No squamous epithelium is present.).  Normal stomach and duodenum - Patient reports having an additional colonoscopy at age 4.  No report available for review. Also reports having had 10+ EGDs in his lifetime.   Past Medical History:  Diagnosis Date   ADD (attention deficit disorder)    Anemia    Anxiety    Arthritis    Asthma    Delayed sleep phase syndrome    Elevated cholesterol    GERD (gastroesophageal reflux disease)    Hiatal hernia    History of colon polyps    History of gallstones    Obesity    Presbyopia    Sleep apnea      Past Surgical History:  Procedure Laterality Date   ARTHROSCOPIC REPAIR ACL     BACK SURGERY     BARIATRIC SURGERY  2013   CHOLECYSTECTOMY     COLONOSCOPY     Last one age 36 [facility]   ESOPHAGOGASTRODUODENOSCOPY     since age 12 last one was in 2010 with Dr Lenon Radar   KNEE SURGERY Left    arthroscopy   LAPAROSCOPIC GASTRIC BAND REMOVAL WITH LAPAROSCOPIC GASTRIC SLEEVE RESECTION     SHOULDER SURGERY Right    arthroscopy   XI ROBOTIC ASSISTED INGUINAL HERNIA REPAIR WITH MESH Right 09/13/2023   Procedure: XI ROBOTIC ASSISTED RIGHT INGUINAL HERNIA REPAIR WITH MESH;  Surgeon: Aldean Hummingbird, MD;  Location: [facility];  Service: General;  Laterality: Right;   Family History  Problem Relation Age of Onset   Deep vein thrombosis Father    Pulmonary embolism Father    Seizures Father    Sleep apnea Father    Diabetes Paternal Uncle    Alzheimer's disease Maternal Grandmother    Skin cancer Paternal Grandmother    Alzheimer's disease Paternal Grandmother    Heart attack Paternal Grandfather    Colon cancer Neg Hx    Esophageal cancer Neg Hx    Social History   Tobacco Use   Smoking status: Former    Current packs/day: 0.00    Types: Cigarettes    Quit date: 08/01/1999    Years since quitting: 24.4   Smokeless tobacco: Never  Vaping Use   Vaping status: Never Used   Substance Use Topics   Alcohol use: Yes    Comment: Occasionally   Drug use: Never   Current Outpatient Medications  Medication Sig Dispense Refill   AMBULATORY NON FORMULARY MEDICATION Continuous positive airway pressure (CPAP): Patient already has the machine (see below) Please provide all supplemental supplies as needed.  Mask: ResMed AirTouch P20 (Size:Large)    CPAP is a : ResMed AirSense 10 with water chamber    [facility]  [phone]  fax [phone] (We offer on-call services after hours and on weekends) [address] [place], [place] [address] 1 Units 1   buPROPion  (WELLBUTRIN  XL) 300 MG 24 hr tablet Take 1 tablet (300 mg total) by mouth daily. 90 tablet 3   clonazePAM  (KLONOPIN ) 1 MG tablet Take 1 tablet (1 mg total) by mouth 2 (two) times daily as needed for anxiety. 60 tablet 5   clotrimazole -betamethasone  (LOTRISONE ) cream Apply 1 Application topically daily. Do not use for more than 14 days consecutively. (Patient taking differently: Apply  1 Application topically daily as needed (Anti fungal). Do not use for more than 14 days consecutively.) 90 g 1   Cyanocobalamin (VITAMIN B-12) 5000 MCG TBDP Place 5,000 mcg under the tongue daily as needed (lethargic).     DULoxetine  (CYMBALTA ) 60 MG capsule Take 1 capsule (60 mg total) by mouth daily. 90 capsule 3   lidocaine  (LMX) 4 % cream Apply 1 application  topically daily as needed (Arthritis).     lisdexamfetamine (VYVANSE ) 70 MG capsule Take 1 capsule (70 mg total) by mouth daily. 30 capsule 0   MULTIPLE VITAMIN PO Take 1 tablet by mouth 2 (two) times daily. Bariatric     ondansetron  (ZOFRAN -ODT) 8 MG disintegrating tablet Take 1 tablet (8 mg total) by mouth every 8 (eight) hours as needed for nausea. 20 tablet 3   tamsulosin  (FLOMAX ) 0.4 MG CAPS capsule Take 1 capsule (0.4 mg total) by mouth at bedtime. 30 capsule 11   traZODone  (DESYREL ) 50 MG tablet Take 1-2 tablets (50-100 mg total) by mouth at  bedtime as needed for sleep. 60 tablet 0   No current facility-administered medications for this visit.   Allergies  Allergen Reactions   Penicillins Hives and Rash   Hydrocodone-Acetaminophen  Itching and Nausea Only     Review of Systems: All systems reviewed and negative except where noted in HPI.     Physical Exam:    Wt Readings from Last 3 Encounters:  12/28/23 210 lb 14.4 oz (95.7 kg)  09/29/23 207 lb 8 oz (94.1 kg)  09/13/23 212 lb (96.2 kg)    There were no vitals taken for this visit. Constitutional:  Pleasant, in no acute distress. Psychiatric: Normal mood and affect. Behavior is normal. Skin: Skin is warm and dry. No rashes noted.   ASSESSMENT AND PLAN;   1) Hiatal hernia 2) GERD 3) History of gastric vertical sleeve - EGD to evaluate size and grade/severity of hiatal hernia along with preoperative evaluation prior to any bariatric surgery revision - Continue antireflux lifestyle/dietary modifications with avoidance of exacerbating type foods  4) Chronic diarrhea - EGD with biopsies and colonoscopy with random directed biopsies - Depending on EGD/colonoscopy results, will again discuss trial of Questran since he did well on that in the past.  Did well with GLP-1 agonists in the past (ADR constipation), but those are no longer covered by his insurance.  Does not want to use Imodium.  Consider Lomotil?  Budesonide?  5) Folate deficiency - Evaluate for mucosal pathology at time of EGD/colonoscopy - Would benefit from starting folic acid.  Can further address at follow-up  6) History of colon polyps Per patient, history of colon polyps on each of his previous colonoscopies.  Limited reports available for review.  Evaluate for polyp surveillance at time of colonoscopy as above  The indications, risks, and benefits of EGD and colonoscopy were explained to the patient in detail. Risks include but are not limited to bleeding, perforation, adverse reaction to  medications, and cardiopulmonary compromise. Sequelae include but are not limited to the possibility of surgery, hospitalization, and mortality. The patient verbalized understanding and wished to proceed. All questions answered, referred to scheduler and bowel prep ordered. Further recommendations pending results of the exam.      Annis Kinder, DO, FACG  01/23/2024, 2:29 PM   Cherre Cornish, NP

## 2024-01-25 ENCOUNTER — Encounter: Payer: Self-pay | Admitting: Medical-Surgical

## 2024-01-25 ENCOUNTER — Ambulatory Visit: Admitting: Medical-Surgical

## 2024-01-25 ENCOUNTER — Other Ambulatory Visit (HOSPITAL_BASED_OUTPATIENT_CLINIC_OR_DEPARTMENT_OTHER): Payer: Self-pay

## 2024-01-25 ENCOUNTER — Other Ambulatory Visit: Payer: Self-pay

## 2024-01-25 VITALS — BP 104/72 | HR 98 | Resp 20 | Ht 72.0 in | Wt 213.0 lb

## 2024-01-25 DIAGNOSIS — R41840 Attention and concentration deficit: Secondary | ICD-10-CM

## 2024-01-25 DIAGNOSIS — F5104 Psychophysiologic insomnia: Secondary | ICD-10-CM

## 2024-01-25 MED ORDER — ESZOPICLONE 3 MG PO TABS
3.0000 mg | ORAL_TABLET | Freq: Every day | ORAL | 0 refills | Status: DC
  Filled 2024-01-25: qty 30, 30d supply, fill #0

## 2024-01-25 NOTE — Progress Notes (Signed)
        Established patient visit  History, exam, impression, and plan:  1. Attention or concentration deficit (Primary) Pleasant 53 year old male presenting today with a history of attention deficit.  He presented approximately 1 month ago with reports that the Vyvanse  40 mg was no longer working for him as his diarrhea had returned and he had absorption concerns.  At that time, he was requesting to go back to Vyvanse  70 mg which had previously worked in the setting of profuse diarrhea.  Today he reports that he has been taking the 70 mg dose which is working very well for him and his concentration and focus have greatly improved.  No concern for side effects or intolerances.  Blood pressure is stable with normal heart rate.  Continue Vyvanse  70 mg daily.  2. Psychophysiological insomnia Discussion held regarding difficulty with sleep.  He has been diagnosed in the past with delayed sleep phase syndrome and reports his problem is not staying asleep is getting to sleep.  He has used multiple agents in the past without good benefit.  About 1 month ago, we decided to trial trazodone .  He took this at the very lowest dose for 7 days but every morning he woke up groggy and unable to function for 1 to 1.5 hours.  Results of his GeneSight test have come in and he is interested in trying something different.  Lunesta, Restoril, Ambien , Belsomra, and Dayvigo are all on the list.  He has tried Ambien  in the past.  Holding off on Restoril as he is already on a benzodiazepine.  Plan to trial Lunesta 3 mg nightly to evaluate response.   Procedures performed this visit: None.  Return in about 3 months (around 04/25/2024) for ADHD/mood/sleep follow up.  __________________________________ Maryl Snook, DNP, APRN, FNP-BC Primary Care and Sports Medicine [facility] [place]

## 2024-01-27 ENCOUNTER — Other Ambulatory Visit (HOSPITAL_BASED_OUTPATIENT_CLINIC_OR_DEPARTMENT_OTHER): Payer: Self-pay

## 2024-02-01 DIAGNOSIS — F902 Attention-deficit hyperactivity disorder, combined type: Secondary | ICD-10-CM | POA: Diagnosis not present

## 2024-02-07 ENCOUNTER — Other Ambulatory Visit (HOSPITAL_BASED_OUTPATIENT_CLINIC_OR_DEPARTMENT_OTHER): Payer: Self-pay

## 2024-02-07 ENCOUNTER — Other Ambulatory Visit: Payer: Self-pay | Admitting: Medical-Surgical

## 2024-02-08 ENCOUNTER — Other Ambulatory Visit: Payer: Self-pay

## 2024-02-08 ENCOUNTER — Other Ambulatory Visit (HOSPITAL_BASED_OUTPATIENT_CLINIC_OR_DEPARTMENT_OTHER): Payer: Self-pay

## 2024-02-08 DIAGNOSIS — F902 Attention-deficit hyperactivity disorder, combined type: Secondary | ICD-10-CM | POA: Diagnosis not present

## 2024-02-08 MED ORDER — ONDANSETRON 8 MG PO TBDP
8.0000 mg | ORAL_TABLET | Freq: Three times a day (TID) | ORAL | 3 refills | Status: DC | PRN
  Filled 2024-02-08: qty 20, 7d supply, fill #0
  Filled 2024-04-09: qty 20, 7d supply, fill #1
  Filled 2024-06-04: qty 20, 7d supply, fill #2
  Filled 2024-07-17: qty 20, 7d supply, fill #3

## 2024-02-08 MED ORDER — LISDEXAMFETAMINE DIMESYLATE 70 MG PO CAPS
70.0000 mg | ORAL_CAPSULE | Freq: Every day | ORAL | 0 refills | Status: DC
  Filled 2024-02-08: qty 30, 30d supply, fill #0

## 2024-02-10 ENCOUNTER — Other Ambulatory Visit: Payer: Self-pay

## 2024-03-02 ENCOUNTER — Other Ambulatory Visit: Payer: Self-pay | Admitting: Medical-Surgical

## 2024-03-02 ENCOUNTER — Other Ambulatory Visit (HOSPITAL_BASED_OUTPATIENT_CLINIC_OR_DEPARTMENT_OTHER): Payer: Self-pay

## 2024-03-02 MED ORDER — ESZOPICLONE 3 MG PO TABS
3.0000 mg | ORAL_TABLET | Freq: Every day | ORAL | 0 refills | Status: DC
  Filled 2024-03-02: qty 30, 30d supply, fill #0

## 2024-03-02 NOTE — Telephone Encounter (Signed)
 Last OV: 01/25/24 Next OV: 04/25/24 Last RF: 01/25/24

## 2024-03-06 ENCOUNTER — Other Ambulatory Visit (HOSPITAL_BASED_OUTPATIENT_CLINIC_OR_DEPARTMENT_OTHER): Payer: Self-pay

## 2024-03-06 ENCOUNTER — Other Ambulatory Visit: Payer: Self-pay

## 2024-03-06 ENCOUNTER — Encounter: Payer: Self-pay | Admitting: Medical-Surgical

## 2024-03-06 ENCOUNTER — Other Ambulatory Visit: Payer: Self-pay | Admitting: Medical-Surgical

## 2024-03-06 MED ORDER — LISDEXAMFETAMINE DIMESYLATE 70 MG PO CAPS
70.0000 mg | ORAL_CAPSULE | Freq: Every day | ORAL | 0 refills | Status: DC
  Filled 2024-03-06 – 2024-03-19 (×2): qty 30, 30d supply, fill #0

## 2024-03-06 NOTE — Telephone Encounter (Addendum)
 Last OV 01/25/2024  Last filled 02/08/2024  will be on out of town next for the next two weeks and will be 100% out of the meds before he returns.

## 2024-03-07 ENCOUNTER — Other Ambulatory Visit (HOSPITAL_BASED_OUTPATIENT_CLINIC_OR_DEPARTMENT_OTHER): Payer: Self-pay

## 2024-03-09 ENCOUNTER — Other Ambulatory Visit: Payer: Self-pay

## 2024-03-15 ENCOUNTER — Encounter: Payer: Self-pay | Admitting: Gastroenterology

## 2024-03-17 ENCOUNTER — Encounter: Payer: Self-pay | Admitting: Gastroenterology

## 2024-03-19 ENCOUNTER — Other Ambulatory Visit (HOSPITAL_BASED_OUTPATIENT_CLINIC_OR_DEPARTMENT_OTHER): Payer: Self-pay

## 2024-03-20 ENCOUNTER — Other Ambulatory Visit (HOSPITAL_BASED_OUTPATIENT_CLINIC_OR_DEPARTMENT_OTHER): Payer: Self-pay

## 2024-03-21 ENCOUNTER — Other Ambulatory Visit (HOSPITAL_BASED_OUTPATIENT_CLINIC_OR_DEPARTMENT_OTHER): Payer: Self-pay

## 2024-03-22 ENCOUNTER — Other Ambulatory Visit (HOSPITAL_BASED_OUTPATIENT_CLINIC_OR_DEPARTMENT_OTHER): Payer: Self-pay

## 2024-03-23 ENCOUNTER — Encounter: Admitting: Gastroenterology

## 2024-04-09 ENCOUNTER — Other Ambulatory Visit: Payer: Self-pay

## 2024-04-09 ENCOUNTER — Other Ambulatory Visit: Payer: Self-pay | Admitting: Medical-Surgical

## 2024-04-11 ENCOUNTER — Other Ambulatory Visit: Payer: Self-pay

## 2024-04-11 MED ORDER — ESZOPICLONE 3 MG PO TABS
3.0000 mg | ORAL_TABLET | Freq: Every day | ORAL | 0 refills | Status: DC
  Filled 2024-04-11: qty 30, 30d supply, fill #0

## 2024-04-11 MED ORDER — ESZOPICLONE 3 MG PO TABS: 3.0000 mg | ORAL_TABLET | Freq: Every day | ORAL | 0 refills | Status: DC

## 2024-04-12 ENCOUNTER — Other Ambulatory Visit: Payer: Self-pay

## 2024-04-12 ENCOUNTER — Other Ambulatory Visit (HOSPITAL_BASED_OUTPATIENT_CLINIC_OR_DEPARTMENT_OTHER): Payer: Self-pay

## 2024-04-25 ENCOUNTER — Ambulatory Visit: Admitting: Medical-Surgical

## 2024-05-01 ENCOUNTER — Other Ambulatory Visit: Payer: Self-pay | Admitting: Medical-Surgical

## 2024-05-01 ENCOUNTER — Other Ambulatory Visit (HOSPITAL_BASED_OUTPATIENT_CLINIC_OR_DEPARTMENT_OTHER): Payer: Self-pay

## 2024-05-01 ENCOUNTER — Other Ambulatory Visit: Payer: Self-pay

## 2024-05-01 MED ORDER — LISDEXAMFETAMINE DIMESYLATE 70 MG PO CAPS
70.0000 mg | ORAL_CAPSULE | Freq: Every day | ORAL | 0 refills | Status: DC
  Filled 2024-05-01: qty 30, 30d supply, fill #0

## 2024-05-14 ENCOUNTER — Other Ambulatory Visit: Payer: Self-pay

## 2024-05-14 ENCOUNTER — Other Ambulatory Visit: Payer: Self-pay | Admitting: Medical-Surgical

## 2024-05-15 ENCOUNTER — Other Ambulatory Visit (HOSPITAL_BASED_OUTPATIENT_CLINIC_OR_DEPARTMENT_OTHER): Payer: Self-pay

## 2024-05-16 NOTE — Progress Notes (Unsigned)
        Established patient visit   History of Present Illness   Discussed the use of AI scribe software for clinical note transcription with the patient, who gave verbal consent to proceed.  History of Present Illness   Jared Tran is a 53 year old male who presents with sleep disturbances and medication management concerns.  Sleep disturbance - Ongoing difficulty with sleep initiation and maintenance - Current prescription of Lunesta  3 mg is ineffective - Has previously doubled Lunesta  dose without medical advice, with no improvement - Recalls a prior sleep study where a significantly higher dose of a sleep aid was administered but unable to remember the medication/dose - Expresses need for a fast-acting sleep aid and continues to take Zzzquil  - After multiple conversations, now agreeable to proceed with referral to psychiatry/psychology  Anxiety and psychological concerns - Ongoing anxiety and concerns - Taking Vyvanse  70mg  daily for inattention but diagnosis unclear as testing showed possible ADHD vs OCD - On Cymbalta  60mg  daily, reports effective - Long term use of Klonopin  1-2mg  daily      Physical Exam   Physical Exam Vitals and nursing note reviewed.  Constitutional:      General: He is not in acute distress.    Appearance: Normal appearance. He is not ill-appearing.  HENT:     Head: Normocephalic and atraumatic.  Cardiovascular:     Rate and Rhythm: Normal rate and regular rhythm.     Pulses: Normal pulses.     Heart sounds: Normal heart sounds. No murmur heard.    No friction rub. No gallop.  Pulmonary:     Effort: Pulmonary effort is normal. No respiratory distress.     Breath sounds: Normal breath sounds.  Skin:    General: Skin is warm and dry.  Neurological:     Mental Status: He is alert and oriented to person, place, and time.  Psychiatric:        Mood and Affect: Mood normal.        Behavior: Behavior normal.        Thought Content: Thought  content normal.        Judgment: Judgment normal.    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 Lunesta  3 mg insufficient. High doses required during sleep studies per patient report. Limitations of increasing Lunesta  dosage discussed. Previous treatments unsuccessful. - Continue Lunesta  3 mg but do NOT self-increase the dose. - Recommend discontinuing Zzzquil.  - Refer to psychology for counseling. - Refer to psychiatry for medication management.  Generalized anxiety disorder and depression Generalized anxiety disorder and depression with ongoing symptoms despite Cymbalta  and Vyvanse . Anxiety possibly amplified by stimulants. No current depressive symptoms, but anxiety and concentration issues persist. - Continue Cymbalta  and Vyvanse . - Continue Klonopin  but work to minimize use to severe panic attacks that do not respond to other measures. - Refer to psychology/psychiatry.  Inattention Inattention with ongoing symptoms managed with Vyvanse  70 mg. Discussed interaction with anxiety and depression. Current regimen provides some control but focus and concentration issues persist. - Continue Vyvanse  70 mg. - Refer to psychology/psychiatry.      Follow up   Return in about 6 months (around 11/17/2024) for Vyvanse  follow up. __________________________________ Zada FREDRIK Palin, DNP, APRN, FNP-BC Primary Care and Sports Medicine [facility] [place]

## 2024-05-17 ENCOUNTER — Encounter: Payer: Self-pay | Admitting: Medical-Surgical

## 2024-05-17 ENCOUNTER — Other Ambulatory Visit (HOSPITAL_BASED_OUTPATIENT_CLINIC_OR_DEPARTMENT_OTHER): Payer: Self-pay

## 2024-05-17 ENCOUNTER — Ambulatory Visit: Admitting: Medical-Surgical

## 2024-05-17 VITALS — BP 132/72 | HR 73 | Resp 20 | Ht 72.0 in | Wt 229.0 lb

## 2024-05-17 DIAGNOSIS — R41840 Attention and concentration deficit: Secondary | ICD-10-CM

## 2024-05-17 DIAGNOSIS — G4733 Obstructive sleep apnea (adult) (pediatric): Secondary | ICD-10-CM

## 2024-05-17 DIAGNOSIS — F1320 Sedative, hypnotic or anxiolytic dependence, uncomplicated: Secondary | ICD-10-CM

## 2024-05-17 DIAGNOSIS — F419 Anxiety disorder, unspecified: Secondary | ICD-10-CM | POA: Diagnosis not present

## 2024-05-17 DIAGNOSIS — F5104 Psychophysiologic insomnia: Secondary | ICD-10-CM

## 2024-05-17 MED ORDER — LISDEXAMFETAMINE DIMESYLATE 70 MG PO CAPS
70.0000 mg | ORAL_CAPSULE | Freq: Every day | ORAL | 0 refills | Status: DC
  Filled 2024-06-05: qty 30, 30d supply, fill #0

## 2024-05-17 MED ORDER — DULOXETINE HCL 60 MG PO CPEP
60.0000 mg | ORAL_CAPSULE | Freq: Every day | ORAL | 3 refills | Status: AC
  Filled 2024-05-17 – 2024-08-30 (×3): qty 90, 90d supply, fill #0

## 2024-05-17 MED ORDER — LISDEXAMFETAMINE DIMESYLATE 70 MG PO CAPS
70.0000 mg | ORAL_CAPSULE | Freq: Every day | ORAL | 0 refills | Status: DC
  Filled 2024-07-26: qty 30, 30d supply, fill #0

## 2024-05-17 MED ORDER — LISDEXAMFETAMINE DIMESYLATE 70 MG PO CAPS: 70.0000 mg | ORAL_CAPSULE | Freq: Every day | ORAL | 0 refills | Status: DC

## 2024-05-29 ENCOUNTER — Encounter: Payer: Self-pay | Admitting: Sports Medicine

## 2024-06-05 ENCOUNTER — Other Ambulatory Visit: Payer: Self-pay

## 2024-06-05 ENCOUNTER — Ambulatory Visit

## 2024-06-05 ENCOUNTER — Other Ambulatory Visit (HOSPITAL_COMMUNITY): Payer: Self-pay

## 2024-06-06 ENCOUNTER — Other Ambulatory Visit (HOSPITAL_COMMUNITY): Payer: Self-pay

## 2024-06-06 ENCOUNTER — Other Ambulatory Visit: Payer: Self-pay

## 2024-06-11 ENCOUNTER — Ambulatory Visit: Payer: Self-pay | Admitting: Licensed Clinical Social Worker

## 2024-06-11 DIAGNOSIS — F411 Generalized anxiety disorder: Secondary | ICD-10-CM

## 2024-06-11 NOTE — Progress Notes (Signed)
 Comprehensive Clinical Assessment (CCA) Note  06/11/2024 Jared Tran [id number]  Time Spent: 9:01  am - 10:03 am: 62 Minutes  Chief Complaint: No chief complaint on file.  Visit Diagnosis: anxiety    Guardian/Payee:  Self/Adult    Paperwork requested: No   Reason for Visit /Presenting Problem: anxious thinking patterns of play by play   Mental Status Exam: Appearance:   Casual     Behavior:  Appropriate and Sharing  Motor:  Normal  Speech/Language:   Normal Rate  Affect:  Appropriate  Mood:  anxious  Thought process:  tangential  Thought content:    Tangential  Sensory/Perceptual disturbances:    WNL  Orientation:  oriented to person, place, and time/date  Attention:  Fair  Concentration:  Fair  Memory:  WNL  Fund of knowledge:   Good  Insight:    Good  Judgment:   Good  Impulse Control:  Good   Reported Symptoms:  people pleasing-doormat, sleep disturbances with Delayed Sleep Phase syndrome diagnosis, ADHD diagnosis, get fixated on things, empathetic, reviews core behaviors and then can get fixated and go down a rabbit hole with excessive researching of various topics  Risk Assessment: Danger to Self:  No Self-injurious Behavior: No Danger to Others: No Duty to Warn:no Physical Aggression / Violence:No  Access to Firearms a concern: No  Gang Involvement:No  Patient / guardian was educated about steps to take if suicide or homicide risk level increases between visits: yes While future psychiatric events cannot be accurately predicted, the patient does not currently require acute inpatient psychiatric care and does not currently meet [facility]  involuntary commitment criteria.  Substance Abuse History: Current substance abuse: No     Caffeine: Energy Drinks and Coffee Tobacco: Alcohol:Social Drinking  Substance use:  Past Psychiatric History:   Previous psychological history is significant for ADHD and anxiety 25 years of medication treatment for panic  attacks  Outpatient Providers:No History of Psych Hospitalization: No  Psychological Testing:  ADHD  Abuse History:  Victim of: Yes.  , emotional   Report needed: No. Victim of Neglect:No. Perpetrator of None  Witness / Exposure to Domestic Violence: No   Protective Services Involvement: No  Witness to [employer] Violence:  No   Family History:  Family History  Problem Relation Age of Onset   Deep vein thrombosis Father    Pulmonary embolism Father    Seizures Father    Sleep apnea Father    Diabetes Paternal Uncle    Alzheimer's disease Maternal Grandmother    Skin cancer Paternal Grandmother    Alzheimer's disease Paternal Grandmother    Heart attack Paternal Grandfather    Colon cancer Neg Hx    Esophageal cancer Neg Hx    Stomach cancer Neg Hx    Pancreatic cancer Neg Hx     Living situation: the patient lives with their family  Sexual Orientation: Straight  Relationship Status: married  Name of spouse / other:Leesa If a parent, number of children / ages:9 son and daughter 36  Support Systems: None  Surveyor, quantity Stress:  No   Income/Employment/Disability: Employment  Financial planner: No   Educational History: Education: Water quality scientist: [employer]  Any cultural differences that may affect / interfere with treatment:  not applicable   Recreation/Hobbies: N/A  Stressors: Health problems   Loss of Job during Covid   Marital or family conflict   Occupational concerns    Strengths: N/A  Barriers:  None   Legal History: Pending legal  issue / charges: The patient has no significant history of legal issues. History of legal issue / charges: N/A  Medical History/Surgical History: not reviewed Past Medical History:  Diagnosis Date   ADD (attention deficit disorder)    Anemia    Anxiety    Arthritis    Asthma    Delayed sleep phase syndrome    Elevated cholesterol    GERD (gastroesophageal reflux disease)    Hiatal  hernia    History of colon polyps    History of gallstones    Obesity    Presbyopia    Sleep apnea     Past Surgical History:  Procedure Laterality Date   ARTHROSCOPIC REPAIR ACL     BACK SURGERY     BARIATRIC SURGERY  2013   CHOLECYSTECTOMY     COLONOSCOPY     Last one age 39 [facility]   ESOPHAGOGASTRODUODENOSCOPY     since age 18 last one was in 2010 with Dr Mickael Balder   KNEE SURGERY Left    arthroscopy   LAPAROSCOPIC GASTRIC BAND REMOVAL WITH LAPAROSCOPIC GASTRIC SLEEVE RESECTION     SHOULDER SURGERY Right    arthroscopy   XI ROBOTIC ASSISTED INGUINAL HERNIA REPAIR WITH MESH Right 09/13/2023   Procedure: XI ROBOTIC ASSISTED RIGHT INGUINAL HERNIA REPAIR WITH MESH;  Surgeon: Tanda Locus, MD;  Location: [facility];  Service: General;  Laterality: Right;    Medications: Current Outpatient Medications  Medication Sig Dispense Refill   AMBULATORY NON FORMULARY MEDICATION Continuous positive airway pressure (CPAP): Patient already has the machine (see below) Please provide all supplemental supplies as needed.  Mask: ResMed AirTouch P20 (Size:Large)    CPAP is a : ResMed AirSense 10 with water chamber    [facility]  [phone]  fax [phone] (We offer on-call services after hours and on weekends) [address] [place], [place] [address] 1 Units 1   buPROPion  (WELLBUTRIN  XL) 300 MG 24 hr tablet Take 1 tablet (300 mg total) by mouth daily. 90 tablet 3   clonazePAM  (KLONOPIN ) 1 MG tablet Take 1 tablet (1 mg total) by mouth 2 (two) times daily as needed for anxiety. 60 tablet 5   clotrimazole -betamethasone  (LOTRISONE ) cream Apply 1 Application topically daily. Do not use for more than 14 days consecutively. (Patient taking differently: Apply 1 Application topically daily as needed (Anti fungal). Do not use for more than 14 days consecutively.) 90 g 1   Cyanocobalamin (VITAMIN B-12) 5000 MCG TBDP Place 5,000 mcg under the tongue daily as needed (lethargic).      DULoxetine  (CYMBALTA ) 60 MG capsule Take 1 capsule (60 mg total) by mouth daily. 90 capsule 3   Eszopiclone  3 MG TABS Take 1 tablet (3 mg total) by mouth at bedtime. Take immediately before bedtime. 30 tablet 0   lidocaine  (LMX) 4 % cream Apply 1 application  topically daily as needed (Arthritis).     lisdexamfetamine (VYVANSE ) 70 MG capsule Take 1 capsule (70 mg total) by mouth daily. 30 capsule 0   [START ON 06/30/2024] lisdexamfetamine (VYVANSE ) 70 MG capsule Take 1 capsule (70 mg total) by mouth daily. 30 capsule 0   [START ON 07/30/2024] lisdexamfetamine (VYVANSE ) 70 MG capsule Take 1 capsule (70 mg total) by mouth daily. 30 capsule 0   MULTIPLE VITAMIN PO Take 1 tablet by mouth 2 (two) times daily. Bariatric     ondansetron  (ZOFRAN -ODT) 8 MG disintegrating tablet Take 1 tablet (8 mg total) by mouth every 8 (eight) hours  as needed for nausea. 20 tablet 3   No current facility-administered medications for this visit.    Allergies  Allergen Reactions   Penicillins Hives and Rash   Hydrocodone-Acetaminophen  Itching and Nausea Only    Diagnoses:  GAD  Psychiatric Treatment: No , N/A  Plan of Care: Resume session next week to gather more intake information as patient mentioned what he referred to as a tramatic event at the tail end of session that more information is needed to determine diagnosis and appropriate plan of care  Narrative:   Jared Tran participated from home, via video, is aware of tele-sessions limitations, and consented to treatment. Therapist participated from home office. We reviewed the limits of confidentiality prior to the start of the evaluation. Jared Tran expressed understanding and agreement to proceed.   A follow-up was scheduled to create a treatment plan and begin treatment. Therapist answered all questions during the evaluation and contact information was provided.    Tawni Louder, LCMHC

## 2024-06-17 ENCOUNTER — Other Ambulatory Visit (HOSPITAL_COMMUNITY): Payer: Self-pay

## 2024-06-18 ENCOUNTER — Ambulatory Visit (INDEPENDENT_AMBULATORY_CARE_PROVIDER_SITE_OTHER): Admitting: Licensed Clinical Social Worker

## 2024-06-18 DIAGNOSIS — F411 Generalized anxiety disorder: Secondary | ICD-10-CM | POA: Diagnosis not present

## 2024-06-18 NOTE — Progress Notes (Signed)
 [facility] Counselor/Therapist Progress Note  Patient ID: Jared Tran, MRN: [id number]    Date: 06/18/24  Time Spent: 10:05  am - 11:08 am : 63 Minutes  Treatment Type: Individual Therapy.  Reported Symptoms: sleep disturbances, blaming  Mental Status Exam: Appearance:  Casual     Behavior: Sharing, Rigid, Rationalizing, Blaming, Monopolizing, and Agitated  Motor: Normal  Speech/Language:  Normal Rate  Affect: Blunt  Mood: anxious and irritable  Thought process: circumstantial, flight of ideas, loose associations, and tangential  Thought content:   Rumination and Tangential  Sensory/Perceptual disturbances:   Flashback  Orientation: oriented to person, place, and time/date  Attention: Fair  Concentration: Fair  Memory: Fragmented  Fund of knowledge:  Fair  Insight:   Fair  Judgment:  Fair  Impulse Control: Poor   Risk Assessment: Danger to Self:  No Self-injurious Behavior: No Danger to Others: No Duty to Warn:no Physical Aggression / Violence:No  Access to Firearms a concern: No  Gang Involvement:No   Subjective:   Jared Tran participated from home, via video, and consented to treatment. I discussed the limitations of evaluation and management by telemedicine and the availability of in person appointments. The patient expressed understanding and agreed to proceed.  Therapist participated from home office.  Jared Tran reviewed the events of the past week. Patient disclosed significant new information at the end of last session, prompting an extended session to assess current mental status and therapeutic needs. Patient expressed frustration with previous therapists and demonstrated a pattern of resisting clinical guidance, often citing his own extensive late-night research on behavior and psychology. He reports being in both couples and family therapy, noting marital strain, including his wife moving to a separate bedroom due to his sleep disorder and  mental health concerns. Patient states his wife frequently threatens divorce if he does not commit to therapy and behavioral change. He acknowledged interest in trauma work and EMDR, and questioned whether his delayed sleep phase syndrome could be ancestral, citing a comment made by a neurologist 10-15 years ago.     Interventions: Solution-Oriented/Positive Psychology  Diagnosis:  Generalized anxiety disorder  Psychiatric Treatment: No , N/A  Treatment Plan:  Client Abilities/Strengths Jared Tran appears open to sessions.    Support System: On going therapy as a couple and 3 other therapist in the past.    Client Treatment Preferences Virtual  Client Statement of Needs Jared Tran would like to learn strategies to manage sleep disturbances that effect excessive worry and her pending desire to divorce him.     Treatment Level N/A Referred Out   Symptoms  Tangential   (Status: maintained) Blaming   (Status: maintained)  Goals:   Shown  presented as highly verbal, frequently redirecting the conversation and attempting to dominate the clinical setting. Demonstrates limited insight into the therapeutic process and challenges clinical recommendations. Affect was congruent but mood appeared guarded and reactive when limitations of current clinical fit were discussed. Patient became visibly disappointed upon hearing recommendation for referral, requesting further explanation, though conversation had to be redirected due to over-talking and justification tendencies. Presenting concerns and behavioral patterns indicate needs that fall outside the current clinician's scope of practice. Significant unresolved trauma with distorted or unclear recollection of childhood events  Likely sleep-related and neuropsychological components requiring integrated or specialized care  Rigid belief systems and difficulty receiving clinical feedback suggest need for a provider with expertise in trauma-informed  care, EMDR, and sleep disorders  Current therapeutic relationship is not an appropriate clinical fit. Therapist  provided clinical feedback regarding the need for higher-level or specialized care Recommendation made for referral back to scheduling team to a clinician with expertise in EMDR, trauma processing, and sleep-related disturbances.     Target Date: 06/18/24 Frequency: N/A Referred  Progress: 0 Modality: individual    Therapist will provide referrals for additional resources as appropriate.  Therapist will provide psycho-education regarding Jared Tran's diagnosis and corresponding treatment approaches and interventions. Licensed Clinical Mental Health Counselor, Tawni Louder, [facility] will support the patient's ability to achieve the goals identified. will employ CBT, BA, Problem-solving, Solution Focused, Mindfulness,  coping skills, & other evidenced-based practices will be used to promote progress towards healthy functioning to help manage decrease symptoms associated with his diagnosis.   Reduce overall level, frequency, and intensity of the feelings of depression, anxiety and panic evidenced by decreased sleep disturbances from 6 to 7 days/week to 0 to 1 days/week per client report for at least 3 consecutive months. Verbally express understanding of the relationship between feelings of depression, anxiety and their impact on thinking patterns and behaviors. Verbalize an understanding of the role that distorted thinking plays in creating fears, excessive worry, and ruminations.  Jared Tran participated in the discussion of Termination of therapy relationship initiated due to clinical misalignment. Patient encouraged to seek appropriate care to address complex needs  No follow-up scheduled)   Tawni Louder, LCMHC

## 2024-07-02 ENCOUNTER — Ambulatory Visit (INDEPENDENT_AMBULATORY_CARE_PROVIDER_SITE_OTHER)

## 2024-07-02 ENCOUNTER — Telehealth: Payer: Self-pay | Admitting: Medical-Surgical

## 2024-07-02 DIAGNOSIS — Z23 Encounter for immunization: Secondary | ICD-10-CM

## 2024-07-02 NOTE — Telephone Encounter (Signed)
 Patient would like to talk to you via MyChart message about his recent therapy sessions.

## 2024-07-10 ENCOUNTER — Ambulatory Visit: Payer: Self-pay

## 2024-07-10 NOTE — Telephone Encounter (Signed)
 Copied from CRM [id number]. Topic: General - Other >> Jul 10, 2024  9:38 AM Alfonso HERO wrote: Reason for CRM: Arianna from [facility] calling to speak to a [employer] in regards to the patients orders. Can someone please call her back  [phone].[email or web address]

## 2024-07-17 ENCOUNTER — Other Ambulatory Visit (HOSPITAL_COMMUNITY): Payer: Self-pay

## 2024-07-17 ENCOUNTER — Other Ambulatory Visit: Payer: Self-pay | Admitting: Medical-Surgical

## 2024-07-17 DIAGNOSIS — R41840 Attention and concentration deficit: Secondary | ICD-10-CM

## 2024-07-17 DIAGNOSIS — F1320 Sedative, hypnotic or anxiolytic dependence, uncomplicated: Secondary | ICD-10-CM

## 2024-07-18 ENCOUNTER — Other Ambulatory Visit: Payer: Self-pay

## 2024-07-18 ENCOUNTER — Other Ambulatory Visit (HOSPITAL_COMMUNITY): Payer: Self-pay

## 2024-07-19 ENCOUNTER — Encounter: Payer: Self-pay | Admitting: Medical-Surgical

## 2024-07-20 ENCOUNTER — Other Ambulatory Visit (HOSPITAL_COMMUNITY): Payer: Self-pay

## 2024-07-20 MED ORDER — CLONAZEPAM 1 MG PO TABS
1.0000 mg | ORAL_TABLET | Freq: Two times a day (BID) | ORAL | 1 refills | Status: DC | PRN
  Filled 2024-07-20: qty 60, 30d supply, fill #0
  Filled 2024-08-30: qty 60, 30d supply, fill #1

## 2024-07-20 NOTE — Telephone Encounter (Signed)
 Last fill 12/28/2023 with 5 refills Last visit 05/17/24 Next visit 11/21/23

## 2024-07-23 ENCOUNTER — Other Ambulatory Visit: Payer: Self-pay

## 2024-07-26 ENCOUNTER — Other Ambulatory Visit (HOSPITAL_COMMUNITY): Payer: Self-pay

## 2024-08-08 NOTE — Progress Notes (Signed)
 Complete physical exam  Patient: Jared Tran   DOB: 1971-08-19   53 y.o. Male  MRN: [id number]  Subjective:    Chief Complaint  Patient presents with   Annual Exam    Jared Tran is a 53 y.o. male who presents today for a complete physical exam. He reports consuming a general diet. Staying physically active with multiple activities. He generally feels well. He reports sleeping poorly. He does not have additional problems to discuss today.    Most recent fall risk assessment:    08/09/2024    8:57 AM  Fall Risk   Falls in the past year? 0  Number falls in past yr: 0  Injury with Fall? 0  Risk for fall due to : No Fall Risks  Follow up Falls evaluation completed     Most recent depression screenings:    05/17/2024   10:38 AM 05/18/2023    9:28 AM  PHQ 2/9 Scores  PHQ - 2 Score 1 2  PHQ- 9 Score 10  7      Data saved with a previous flowsheet row definition    Vision:Within last year and Dental: No current dental problems and Receives regular dental care    Patient Care Team: Willo Mini, NP as PCP - General (Nurse Practitioner)   Outpatient Medications Prior to Visit  Medication Sig   AMBULATORY NON FORMULARY MEDICATION Continuous positive airway pressure (CPAP): Patient already has the machine (see below) Please provide all supplemental supplies as needed.  Mask: ResMed AirTouch P20 (Size:Large)    CPAP is a : ResMed AirSense 10 with water chamber    [facility]  [phone]  fax [phone] (We offer on-call services after hours and on weekends) [address] [place], [place] [address]   buPROPion  (WELLBUTRIN  XL) 300 MG 24 hr tablet Take 1 tablet (300 mg total) by mouth daily.   clonazePAM  (KLONOPIN ) 1 MG tablet Take 1 tablet (1 mg total) by mouth 2 (two) times daily as needed for anxiety.   clotrimazole -betamethasone  (LOTRISONE ) cream Apply 1 Application topically daily. Do not use for more than 14 days consecutively. (Patient  taking differently: Apply 1 Application topically daily as needed (Anti fungal). Do not use for more than 14 days consecutively.)   Cyanocobalamin (VITAMIN B-12) 5000 MCG TBDP Place 5,000 mcg under the tongue daily as needed (lethargic).   DULoxetine  (CYMBALTA ) 60 MG capsule Take 1 capsule (60 mg total) by mouth daily.   lidocaine  (LMX) 4 % cream Apply 1 application  topically daily as needed (Arthritis).   lisdexamfetamine (VYVANSE ) 70 MG capsule Take 1 capsule (70 mg total) by mouth daily.   lisdexamfetamine (VYVANSE ) 70 MG capsule Take 1 capsule (70 mg total) by mouth daily.   lisdexamfetamine (VYVANSE ) 70 MG capsule Take 1 capsule (70 mg total) by mouth daily.   MULTIPLE VITAMIN PO Take 1 tablet by mouth 2 (two) times daily. Bariatric   ondansetron  (ZOFRAN -ODT) 8 MG disintegrating tablet Take 1 tablet (8 mg total) by mouth every 8 (eight) hours as needed for nausea.   [DISCONTINUED] lisdexamfetamine (VYVANSE ) 70 MG capsule Take 1 capsule (70 mg total) by mouth daily.   [DISCONTINUED] lisdexamfetamine (VYVANSE ) 70 MG capsule Take 1 capsule (70 mg total) by mouth daily.   [DISCONTINUED] Eszopiclone  3 MG TABS Take 1 tablet (3 mg total) by mouth at bedtime. Take immediately before bedtime.   No facility-administered medications prior to visit.    Review of Systems  Constitutional:  Negative for  chills, fever, malaise/fatigue and weight loss.  HENT:  Negative for congestion, ear pain, hearing loss, sinus pain and sore throat.   Eyes:  Negative for blurred vision, photophobia and pain.  Respiratory:  Negative for cough, shortness of breath and wheezing.   Cardiovascular:  Negative for chest pain, palpitations and leg swelling.  Gastrointestinal:  Negative for abdominal pain, constipation, diarrhea, heartburn, nausea and vomiting.  Genitourinary:  Negative for dysuria, frequency and urgency.  Musculoskeletal:  Negative for falls and neck pain.  Skin:  Negative for itching and rash.   Neurological:  Negative for dizziness, weakness and headaches.  Endo/Heme/Allergies:  Negative for polydipsia. Does not bruise/bleed easily.  Psychiatric/Behavioral:  Negative for depression, substance abuse and suicidal ideas. The patient is nervous/anxious and has insomnia.      Objective:     BP 115/69 (BP Location: Left Arm, Cuff Size: Normal)   Pulse 78   Resp 20   Ht 6' (1.829 m)   Wt 242 lb (109.8 kg)   SpO2 100%   BMI 32.82 kg/m    Physical Exam Vitals reviewed.  Constitutional:      General: He is not in acute distress.    Appearance: Normal appearance. He is obese. He is not ill-appearing.  HENT:     Head: Normocephalic and atraumatic.     Right Ear: Tympanic membrane, ear canal and external ear normal. There is no impacted cerumen.     Left Ear: Tympanic membrane, ear canal and external ear normal. There is no impacted cerumen.     Nose: Nose normal. No congestion or rhinorrhea.     Mouth/Throat:     Mouth: Mucous membranes are moist.     Pharynx: No oropharyngeal exudate or posterior oropharyngeal erythema.  Eyes:     General: No scleral icterus.       Right eye: No discharge.        Left eye: No discharge.     Extraocular Movements: Extraocular movements intact.     Conjunctiva/sclera: Conjunctivae normal.     Pupils: Pupils are equal, round, and reactive to light.  Neck:     Thyroid : No thyromegaly.     Vascular: No carotid bruit or JVD.     Trachea: Trachea normal.  Cardiovascular:     Rate and Rhythm: Normal rate and regular rhythm.     Pulses: Normal pulses.     Heart sounds: Normal heart sounds. No murmur heard.    No friction rub. No gallop.  Pulmonary:     Effort: Pulmonary effort is normal. No respiratory distress.     Breath sounds: Normal breath sounds. No wheezing.  Abdominal:     General: Bowel sounds are normal. There is no distension.     Palpations: Abdomen is soft.     Tenderness: There is no abdominal tenderness. There is no  guarding.  Musculoskeletal:        General: Normal range of motion.     Cervical back: Normal range of motion and neck supple.  Lymphadenopathy:     Cervical: No cervical adenopathy.  Skin:    General: Skin is warm and dry.  Neurological:     Mental Status: He is alert and oriented to person, place, and time.     Cranial Nerves: No cranial nerve deficit.  Psychiatric:        Mood and Affect: Mood normal.        Behavior: Behavior normal.        Thought Content: Thought  content normal.        Judgment: Judgment normal.      No results found for any visits on 08/09/24.     Assessment & Plan:    Routine Health Maintenance and Physical Exam  Immunization History  Administered Date(s) Administered   INFLUENZA, HIGH DOSE SEASONAL PF 07/12/2014   Influenza Split 07/01/2011, 06/09/2017   Influenza, Quadrivalent, Recombinant, Inj, Pf 07/08/2018   Influenza, Seasonal, Injecte, Preservative Fre 05/18/2023, 05/25/2023, 07/02/2024   Influenza,inj,Quad PF,6+ Mos 07/13/2014, 07/07/2021, 06/23/2022   Influenza,inj,Quad PF,6-35 Mos 06/30/2015, 08/21/2019   Influenza,inj,quad, With Preservative 07/11/2013, 07/13/2014, 06/23/2016   Influenza,trivalent, recombinat, inj, PF 07/08/2018   PFIZER(Purple Top)SARS-COV-2 Vaccination 12/18/2019, 01/15/2020, 09/12/2020, 05/05/2021   Tdap 07/01/2011, 03/16/2020   Zoster Recombinant(Shingrix) 10/29/2019, 02/14/2020    Health Maintenance  Topic Date Due   Hepatitis B Vaccines 19-59 Average Risk (1 of 3 - 19+ 3-dose series) Never done   Pneumococcal Vaccine: 50+ Years (1 of 1 - PCV) Never done   Colonoscopy  08/01/2019   COVID-19 Vaccine (5 - 2025-26 season) 05/28/2024   DTaP/Tdap/Td (3 - Td or Tdap) 03/16/2030   Influenza Vaccine  Completed   Hepatitis C Screening  Completed   HIV Screening  Completed   Zoster Vaccines- Shingrix  Completed   HPV VACCINES  Aged Out   Meningococcal B Vaccine  Aged Out    Discussed health benefits of physical  activity, and encouraged him to engage in regular exercise appropriate for his age and condition.  1. Annual physical exam (Primary) Checking labs as below. UTD on preventative care. Wellness information provided with AVS. - Lipid panel - CBC with Differential/Platelet - CMP14+EGFR  2. Dyslipidemia Checking lipids and CMP today. - CMP14+EGFR - Lipid panel  3. H/O gastric sleeve Due for annual labs for follow up on gastric sleeve history.  - Lipid panel - CBC with Differential/Platelet - Hemoglobin A1c - CMP14+EGFR - Copper , serum - Zinc  - Ferritin - Folate - Vitamin B12 - Vitamin B1 - Parathyroid  hormone, intact (no Ca) - VITAMIN D  25 Hydroxy (Vit-D Deficiency, Fractures)  4. Prostate cancer screening Discussed PSA testing. Patient agreeable so adding to labs today.  - PSA Total (Reflex To Free)  5. Class 1 obesity due to excess calories without serious comorbidity with body mass index (BMI) of 32.0 to 32.9 in adult Checking TSH and A1c today.  - TSH - Hemoglobin A1c  6. Male hypogonadism Checking testosterone  today.  - Testosterone   7. Benzodiazepine dependence (HCC) Encouraged setting up the appointment to speak with psychiatry for medication management. Unhappy with the counselor he saw and wants to see someone else. Referral placed.  - Ambulatory referral to Behavioral Health  8. Attention or concentration deficit Continue Vyvanse  as prescribed. Encouraged psychiatry appointment scheduling. Referring to behavioral health to connect with a  different counselor.  - Ambulatory referral to Behavioral Health - lisdexamfetamine (VYVANSE ) 70 MG capsule; Take 1 capsule (70 mg total) by mouth daily.  Dispense: 30 capsule; Refill: 0 - lisdexamfetamine (VYVANSE ) 70 MG capsule; Take 1 capsule (70 mg total) by mouth daily.  Dispense: 30 capsule; Refill: 0  9. Psychophysiological insomnia Never picked up Lunesta  per patient report due to an issue with the pharmacy. Resending  Lunesta  for a trial. Counselor referred him to pulmonology/neurology. Feel this will be a good adjunct for management of his chronic sleep concerns.  - Ambulatory referral to Behavioral Health  10. Anxiety New referral placed as noted above.  - Ambulatory referral to [facility]  Return in about 6 months (around 02/06/2025) for ADHD follow up.     Chriss Mannan, NP

## 2024-08-09 ENCOUNTER — Other Ambulatory Visit (HOSPITAL_BASED_OUTPATIENT_CLINIC_OR_DEPARTMENT_OTHER): Payer: Self-pay

## 2024-08-09 ENCOUNTER — Encounter: Payer: Self-pay | Admitting: Medical-Surgical

## 2024-08-09 ENCOUNTER — Other Ambulatory Visit (HOSPITAL_COMMUNITY): Payer: Self-pay

## 2024-08-09 ENCOUNTER — Other Ambulatory Visit: Payer: Self-pay

## 2024-08-09 ENCOUNTER — Ambulatory Visit: Admitting: Medical-Surgical

## 2024-08-09 VITALS — BP 115/69 | HR 78 | Resp 20 | Ht 72.0 in | Wt 242.0 lb

## 2024-08-09 DIAGNOSIS — Z903 Acquired absence of stomach [part of]: Secondary | ICD-10-CM

## 2024-08-09 DIAGNOSIS — E6609 Other obesity due to excess calories: Secondary | ICD-10-CM

## 2024-08-09 DIAGNOSIS — Z0000 Encounter for general adult medical examination without abnormal findings: Secondary | ICD-10-CM | POA: Diagnosis not present

## 2024-08-09 DIAGNOSIS — E785 Hyperlipidemia, unspecified: Secondary | ICD-10-CM | POA: Diagnosis not present

## 2024-08-09 DIAGNOSIS — F419 Anxiety disorder, unspecified: Secondary | ICD-10-CM

## 2024-08-09 DIAGNOSIS — F1320 Sedative, hypnotic or anxiolytic dependence, uncomplicated: Secondary | ICD-10-CM

## 2024-08-09 DIAGNOSIS — F5104 Psychophysiologic insomnia: Secondary | ICD-10-CM

## 2024-08-09 DIAGNOSIS — Z6835 Body mass index (BMI) 35.0-35.9, adult: Secondary | ICD-10-CM

## 2024-08-09 DIAGNOSIS — E291 Testicular hypofunction: Secondary | ICD-10-CM

## 2024-08-09 DIAGNOSIS — Z125 Encounter for screening for malignant neoplasm of prostate: Secondary | ICD-10-CM

## 2024-08-09 DIAGNOSIS — R41840 Attention and concentration deficit: Secondary | ICD-10-CM

## 2024-08-09 DIAGNOSIS — E66812 Obesity, class 2: Secondary | ICD-10-CM

## 2024-08-09 MED ORDER — ESZOPICLONE 3 MG PO TABS
3.0000 mg | ORAL_TABLET | Freq: Every day | ORAL | 0 refills | Status: DC
  Filled 2024-08-09: qty 30, 30d supply, fill #0

## 2024-08-09 NOTE — Patient Instructions (Signed)

## 2024-08-28 DIAGNOSIS — F902 Attention-deficit hyperactivity disorder, combined type: Secondary | ICD-10-CM | POA: Diagnosis not present

## 2024-08-28 DIAGNOSIS — F411 Generalized anxiety disorder: Secondary | ICD-10-CM | POA: Diagnosis not present

## 2024-08-30 ENCOUNTER — Other Ambulatory Visit: Payer: Self-pay | Admitting: Medical-Surgical

## 2024-08-30 ENCOUNTER — Other Ambulatory Visit: Payer: Self-pay

## 2024-08-30 DIAGNOSIS — F902 Attention-deficit hyperactivity disorder, combined type: Secondary | ICD-10-CM | POA: Diagnosis not present

## 2024-08-30 DIAGNOSIS — F411 Generalized anxiety disorder: Secondary | ICD-10-CM | POA: Diagnosis not present

## 2024-08-30 MED ORDER — ONDANSETRON 8 MG PO TBDP
8.0000 mg | ORAL_TABLET | Freq: Three times a day (TID) | ORAL | 1 refills | Status: AC | PRN
  Filled 2024-08-30: qty 20, 7d supply, fill #0
  Filled 2024-10-11: qty 20, 7d supply, fill #1

## 2024-08-31 ENCOUNTER — Other Ambulatory Visit (HOSPITAL_COMMUNITY): Payer: Self-pay

## 2024-08-31 ENCOUNTER — Other Ambulatory Visit: Payer: Self-pay

## 2024-09-03 ENCOUNTER — Other Ambulatory Visit: Payer: Self-pay | Admitting: Medical-Surgical

## 2024-09-03 DIAGNOSIS — R41840 Attention and concentration deficit: Secondary | ICD-10-CM

## 2024-09-06 DIAGNOSIS — F902 Attention-deficit hyperactivity disorder, combined type: Secondary | ICD-10-CM | POA: Diagnosis not present

## 2024-09-06 DIAGNOSIS — F411 Generalized anxiety disorder: Secondary | ICD-10-CM | POA: Diagnosis not present

## 2024-09-06 NOTE — Telephone Encounter (Signed)
 LISDEXAMFETAMINE Last OV: 08/09/24 Next OV: 11/21/23 Last RF: 08/09/24

## 2024-09-07 ENCOUNTER — Other Ambulatory Visit: Payer: Self-pay

## 2024-09-07 ENCOUNTER — Other Ambulatory Visit (HOSPITAL_COMMUNITY): Payer: Self-pay

## 2024-09-07 MED ORDER — LISDEXAMFETAMINE DIMESYLATE 70 MG PO CAPS
70.0000 mg | ORAL_CAPSULE | Freq: Every day | ORAL | 0 refills | Status: AC
  Filled 2024-09-07: qty 30, 30d supply, fill #0

## 2024-09-13 ENCOUNTER — Encounter: Payer: Self-pay | Admitting: Professional

## 2024-09-13 ENCOUNTER — Ambulatory Visit: Admitting: Professional

## 2024-09-13 DIAGNOSIS — F902 Attention-deficit hyperactivity disorder, combined type: Secondary | ICD-10-CM

## 2024-09-13 DIAGNOSIS — F411 Generalized anxiety disorder: Secondary | ICD-10-CM

## 2024-09-13 NOTE — Progress Notes (Deleted)
 Behavioral Health Treatment Plan   Name:Dontai Jedidiah Demartini [facility]  MRN: [id number]   Treatment Plan Development Date: 09/13/2024   Strengths: {BH Strengths:210964339}  Supports: {BH Supports:210964340}   Client Statement of Needs: ***   Treatment Level: Outpatient Individual Therapy  Client Treatment Preferences:***   Diagnosis: Anxiety  Generalized anxiety disorder  Symptoms:  {Anxiety Symptoms:210964283}  Goals:  {Anxiety Goals:210964284}  Objectives: Target Date For All Objectives: 09/13/2025  {Anxiety Objectives:210964285}  Progress Documentation:  {Progress Documentation:210964338}  Interventions:  {Anxiety Interventions:210964286}   Expected duration of treatment: one year  Party responsible for implementation of interventions: Rollo L. [place], [facility].   This plan has been reviewed and created by the following participants: Rollo L. [place], [facility] and patient Tailor [place].   A new plan will be created at least every 12 months.  The patient fully participated in the development of treatment plan with the clinician and verbally consents to such treatment.   Patient Treatment Plan Signature Obtained: {BH Signature Options:210964341}  Nathanel Collet, [facility]                [place], [facility]
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
## 2024-09-16 ENCOUNTER — Other Ambulatory Visit: Payer: Self-pay | Admitting: Medical-Surgical

## 2024-09-17 ENCOUNTER — Other Ambulatory Visit (HOSPITAL_BASED_OUTPATIENT_CLINIC_OR_DEPARTMENT_OTHER): Payer: Self-pay

## 2024-09-17 ENCOUNTER — Other Ambulatory Visit: Payer: Self-pay

## 2024-09-17 MED ORDER — ESZOPICLONE 3 MG PO TABS: 3.0000 mg | ORAL_TABLET | Freq: Every day | ORAL | 0 refills | Status: DC

## 2024-09-17 MED ORDER — ESZOPICLONE 3 MG PO TABS
3.0000 mg | ORAL_TABLET | Freq: Every day | ORAL | 0 refills | Status: DC
  Filled 2024-09-17: qty 30, 30d supply, fill #0

## 2024-10-01 ENCOUNTER — Ambulatory Visit: Admitting: Professional

## 2024-10-04 ENCOUNTER — Other Ambulatory Visit (HOSPITAL_BASED_OUTPATIENT_CLINIC_OR_DEPARTMENT_OTHER): Payer: Self-pay

## 2024-10-11 ENCOUNTER — Other Ambulatory Visit: Payer: Self-pay

## 2024-10-11 ENCOUNTER — Other Ambulatory Visit: Payer: Self-pay | Admitting: Medical-Surgical

## 2024-10-11 ENCOUNTER — Other Ambulatory Visit (HOSPITAL_COMMUNITY): Payer: Self-pay

## 2024-10-11 DIAGNOSIS — F1320 Sedative, hypnotic or anxiolytic dependence, uncomplicated: Secondary | ICD-10-CM

## 2024-10-11 DIAGNOSIS — R41840 Attention and concentration deficit: Secondary | ICD-10-CM

## 2024-10-11 NOTE — Telephone Encounter (Signed)
 Last filled VYVANSE : 09/07/2024  Last Filled ClonazePAM : 07/20/2024  Last OV: 08/09/2024  Upcoming appointment: 11/20/2024  Patient wants sent to mail order.

## 2024-10-12 NOTE — Telephone Encounter (Signed)
 LMVM to get clarification on psychiatry.

## 2024-10-17 ENCOUNTER — Other Ambulatory Visit: Payer: Self-pay | Admitting: Medical-Surgical

## 2024-10-17 DIAGNOSIS — F1320 Sedative, hypnotic or anxiolytic dependence, uncomplicated: Secondary | ICD-10-CM

## 2024-10-17 DIAGNOSIS — R41840 Attention and concentration deficit: Secondary | ICD-10-CM

## 2024-10-17 MED ORDER — ESZOPICLONE 3 MG PO TABS
3.0000 mg | ORAL_TABLET | Freq: Every day | ORAL | 0 refills | Status: AC
  Filled 2024-10-17: qty 30, 30d supply, fill #0

## 2024-10-17 MED ORDER — CLONAZEPAM 1 MG PO TABS
1.0000 mg | ORAL_TABLET | Freq: Two times a day (BID) | ORAL | 1 refills | Status: AC | PRN
  Filled 2024-10-17: qty 60, 30d supply, fill #0

## 2024-10-17 MED ORDER — LISDEXAMFETAMINE DIMESYLATE 70 MG PO CAPS
70.0000 mg | ORAL_CAPSULE | Freq: Every day | ORAL | 0 refills | Status: AC
  Filled 2024-10-17: qty 30, 30d supply, fill #0

## 2024-10-18 ENCOUNTER — Ambulatory Visit: Admitting: Professional

## 2024-10-18 ENCOUNTER — Encounter: Payer: Self-pay | Admitting: Professional

## 2024-10-18 ENCOUNTER — Other Ambulatory Visit: Payer: Self-pay

## 2024-10-18 DIAGNOSIS — F902 Attention-deficit hyperactivity disorder, combined type: Secondary | ICD-10-CM | POA: Diagnosis not present

## 2024-10-18 DIAGNOSIS — F411 Generalized anxiety disorder: Secondary | ICD-10-CM

## 2024-10-18 NOTE — Progress Notes (Signed)
 Behavioral Health Treatment Plan   Name:Jared Tran               MRN: [id number] Jared Tran PPO   Treatment Plan Development Date: 10/18/2024   Strengths: insightful, intelligent, wants help, willing to make changes    Supports: Dad   Client Statement of Needs: 1-I need help with anxiety, 2-I need help with the ADHD, the over thinking, it can be good or bad and with work it is good, situations at home it can be bad 3-Pt sees himself as unlikable and inadequate, 4-Pt has a sense of entitlement related to situation he is in, 5-unresolved childhood issues since he was abandoned by mother   Treatment Level: Individual Outpatient   Client Treatment Preferences: online and inperson   Diagnosis: ADHD   Symptoms: Often has difficulty sustaining attention in tasks or play activities (e.g., has difficulty remaining focused during lectures, conversations, or lengthy reading)., Often does not seem to listen when spoken to directly (e.g., mind seems elsewhere, even in the absence of any obvious distraction)., Often has difficulty organizing tasks and activities (e.g., difficulty managing sequential tasks; difficulty keeping materials and belongings in order; messy, disorganized work; has poor time management; fails to meet deadlines)., Is often easily distracted by extraneous stimuli (for older adolescents and adults, may include unrelated thoughts)., Often talks excessively., and Often blurts out an answer before a question has been completed (e.g., completes people's sentences; cannot wait for turn in conversation).   Goals: Reduce impulsive behaviors, improve concentration, and increase focus on less stimulating activities or tasks., Reduce effect of symptoms on day-to-day functioning., and Develop balance and day-to day functioning across all settings.   Objectives: Provide feedback regarding past and current symptoms of ADHD and their effects on day-to-day functioning., Take prescribed  medication consistently and provide feedback to prescriber, as needed., Identify, challenge, and manage negative self-talk that contribute to dysfunctional feelings and behaviors., Take inventory of specific ADHD symptoms and behaviors that cause distress and/or maladaptive behavior or functioning., Practice and implement skills, on a daily basis, to proactively manage symptoms and maintain mindfulness of symptoms., and Learn and employ calming and relaxation skills and tools to address physiological and psychological symptoms of ADHD including restlessness.   Target Date For All Objectives: one year   Progress Documentation: Initial plan; new pt   Interventions: Psychoeducation regarding diagnoses and treatment., CBT - reframing, challenging, cognitive restructuring., Relaxation and mindfulness., Distress Tolerance., Communication skills - conflict resolution., and Social skills.   Diagnosis: Generalized Anxiety disorder   Symptoms: Excessive and/or unrealistic worry that is difficult to control occurring more days than not for at least 6 months about a number of events or activities, Difficulty managing worry, Restlessness or feeling keyed up or on edge, Being easily fatigued, Difficulty concentrating or mind going blank, Irritability, Muscle Tension, and Sleep disturbance (Difficulty falling or staying asleep, restlessness, or unsatisfying sleep   Goals: Improve daily functioning by reducing overall anxiety symptoms including intensity, frequency, and duration of symptoms., Resolve issues and concerns that are resulting in anxiety., Build and employ tools and skills to reduce symptoms of anxiety, worry, and improve functioning day-to-day., and Address negative cognitions, distortions, and behaviors that contribute to anxiety symptoms and affect overall functioning.   Objectives:    Meet with psychiatric provider, complete evaluation, and discuss psychotropic medication., Develop coping tools to  manage anxiety including mindfulness, acceptance, relaxation, reframing, and challenging negative thoughts and feelings., Identify, challenge, and manage negative self-talk, negative thinking about self  and others, and maintain mindfulness regarding self-talk and cognitive distortions., Develop consistent self-care routine including exercise, healthful eating, consistent sleep., Identify contributing factors to current anxiety including family of origin issues, past trauma, significant stressors, and negative cognitions., Identify contributing factors to anxiety including previous or current situations., and Maintain mindfulness of symptoms and develop protocol to address symptoms to prevent relapse.   Target Date For All Objectives: one year   Progress Documentation: Initial plan; new pt   Interventions: Psychoeducation regarding diagnoses and treatment, Rapport Building, CBT - reframing, challenging, cognitive restructuring, Relaxation and mindfulness, Distress Tolerance, Communication skills - conflict resolution, Social skills, Assertiveness, Self-Care - exercise, sleep, nutrition, Referral to [facility] Psychiatric, Problem Solving, Role Play, Solution Focused, Strength-based, and Anger Management Training   Expected duration of treatment: one year   Party responsible for implementation of interventions: Rollo L. [place], [facility].   This plan has been reviewed and created by the following participants: Rollo L. [place], [facility] and patient Jared Tran    A new plan will be created at least every 12 months.   The patient fully participated in the development of treatment plan with the clinician and verbally consents to such treatment.    Patient Treatment Plan Signature Obtained: No, pending signature page.  Nathanel Gerome, [facility]

## 2024-10-18 NOTE — Progress Notes (Signed)
"    [place] [facility] Counselor/Therapist Progress Note  Patient ID: Jared Tran, MRN: [id number],    Date: 10/18/2024  Time Spent: 55 minutes 909-1005am  Treatment Type: Individual Therapy  Risk Assessment: Danger to Self:  No Self-injurious Behavior: No Danger to Others: No  Subjective: This session was held via video teletherapy. The patient consented to video teletherapy and was located in his home during this session. He is aware it is the responsibility of the patient to secure confidentiality on his end of the session. The provider was in a private home office for the duration of this session.    The patient arrived on time for his Caregility appointment.  Issues addressed: 1-pt admits anger related to how things have gone in the marriage -he was 49 before getting married with the idea that it would be for life -pt admits he feels deflated over the past seven years -he calls his wife a surveyor, mining, a little Hitler, a bully  -it's the good for thee is not for me stating his wife does the same thing as far as parenting -the night of the incident his daughter said she was not fucking doing that -he went to her room and threatened to kick the funking door in 2-mood -pt appeared frustrated at times when Clinician attempt to redirect -he struggled to slow down and allow for interaction 3-treatment planning -initiated with patient during session but due to agitation was completed by therapist after session -will review with pt at next visit and make modifications as needed  Interventions: see treatment plan  Diagnosis:Generalized anxiety disorder  Attention deficit hyperactivity disorder (ADHD), combined type  Plan:  -meet again on Thursday, November 01, 2024 at [place] in person  [place] L. [place], [facility] "

## 2024-11-01 ENCOUNTER — Encounter: Payer: Self-pay | Admitting: Professional

## 2024-11-01 ENCOUNTER — Ambulatory Visit: Admitting: Professional

## 2024-11-01 DIAGNOSIS — F411 Generalized anxiety disorder: Secondary | ICD-10-CM

## 2024-11-01 DIAGNOSIS — F902 Attention-deficit hyperactivity disorder, combined type: Secondary | ICD-10-CM

## 2024-11-01 NOTE — Progress Notes (Signed)
"    [place] [facility] Counselor/Therapist Progress Note  Patient ID: Jared Tran, MRN: [id number],    Date: 11/01/2024  Time Spent: 53 minutes 908-1001am  Treatment Type: Individual Therapy  Risk Assessment: Danger to Self:  No Self-injurious Behavior: No Danger to Others: No  Subjective: The patient arrived on time for his in person appointment.  Issues addressed: 1-mood -pleasant and easily engaged -talkative 2-concerns regarding treatment -pt reports he struggled last session with interactions with Clinician -pt addressed a statement the Clinician made that caught him off guard -Clinician thanked pt for discussing and gave him full permission to address any concern -he shared that our office mgr reached out to check how services were going and he shared some concerns -office manager was helpful and was able to provide pt a better understanding of why the treatment plan is important -he admits that he was not going to come to our session but after talking with Delon he decided to come -addressed any concerns that pt had and provided him insight into how I deliver therapy services 3-treatment planning -answered pt questions regarding the mechanics of the treatment plan -explained how the treatment plan is used in sessions and the process for reviewing progress -we discussed the treatment plan, he understands, is in agreement, and signed the completed plan -pt provided copy of plan and the original was scanned into EPIC by Donzell Minor, admin at [facility]  Interventions: see treatment plan  Diagnosis:Generalized anxiety disorder  Attention deficit hyperactivity disorder (ADHD), combined type  R/O OCD  Plan:  -pt is to look back at his relationships (personal and professional) and cite origin of any issues or concerns -meet again on Thursday, November 01, 2024 at [facility] in person   [place], [facility] "

## 2024-11-15 ENCOUNTER — Ambulatory Visit: Admitting: Professional

## 2024-11-20 ENCOUNTER — Ambulatory Visit: Admitting: Medical-Surgical

## 2024-11-29 ENCOUNTER — Ambulatory Visit: Admitting: Professional

## 2024-12-13 ENCOUNTER — Ambulatory Visit: Admitting: Professional

## 2024-12-27 ENCOUNTER — Ambulatory Visit: Admitting: Professional

## 2025-02-06 ENCOUNTER — Ambulatory Visit: Admitting: Medical-Surgical
